# Patient Record
Sex: Female | Born: 1990
Health system: Southern US, Community
[De-identification: ages and names within clinical notes are randomized; demographics above are authoritative.]

## PROBLEM LIST (undated history)

## (undated) ENCOUNTER — Inpatient Hospital Stay (HOSPITAL_COMMUNITY): Payer: Self-pay

## (undated) DIAGNOSIS — Z91410 Personal history of adult physical and sexual abuse: Secondary | ICD-10-CM

## (undated) DIAGNOSIS — Z87820 Personal history of traumatic brain injury: Secondary | ICD-10-CM

## (undated) DIAGNOSIS — T148XXA Other injury of unspecified body region, initial encounter: Secondary | ICD-10-CM

## (undated) DIAGNOSIS — F4310 Post-traumatic stress disorder, unspecified: Secondary | ICD-10-CM

## (undated) DIAGNOSIS — F418 Other specified anxiety disorders: Secondary | ICD-10-CM

## (undated) HISTORY — PX: THERAPEUTIC ABORTION: SHX798

## (undated) HISTORY — PX: HERNIA REPAIR: SHX51

---

## 2018-10-02 ENCOUNTER — Emergency Department (HOSPITAL_COMMUNITY)
Admission: EM | Admit: 2018-10-02 | Discharge: 2018-10-02 | Disposition: A | Discharge status: Home / Self Care | Payer: Medicaid Other | Attending: Emergency Medicine | Admitting: Emergency Medicine

## 2018-10-02 ENCOUNTER — Other Ambulatory Visit: Payer: Self-pay

## 2018-10-02 ENCOUNTER — Encounter (HOSPITAL_COMMUNITY): Payer: Self-pay | Admitting: Emergency Medicine

## 2018-10-02 DIAGNOSIS — R112 Nausea with vomiting, unspecified: Secondary | ICD-10-CM

## 2018-10-02 DIAGNOSIS — K920 Hematemesis: Secondary | ICD-10-CM

## 2018-10-02 DIAGNOSIS — O210 Mild hyperemesis gravidarum: Secondary | ICD-10-CM | POA: Diagnosis not present

## 2018-10-02 DIAGNOSIS — Z3A01 Less than 8 weeks gestation of pregnancy: Secondary | ICD-10-CM | POA: Diagnosis not present

## 2018-10-02 LAB — CBC
HCT: 36.7 % (ref 36.0–46.0)
Hemoglobin: 13.1 g/dL (ref 12.0–15.0)
MCH: 32 pg (ref 26.0–34.0)
MCHC: 35.7 g/dL (ref 30.0–36.0)
MCV: 89.7 fL (ref 80.0–100.0)
Platelets: 207 10*3/uL (ref 150–400)
RBC: 4.09 MIL/uL (ref 3.87–5.11)
RDW: 11.9 % (ref 11.5–15.5)
WBC: 10.5 10*3/uL (ref 4.0–10.5)
nRBC: 0 % (ref 0.0–0.2)

## 2018-10-02 LAB — BASIC METABOLIC PANEL
Anion gap: 7 (ref 5–15)
BUN: 10 mg/dL (ref 6–20)
CO2: 23 mmol/L (ref 22–32)
Calcium: 8.9 mg/dL (ref 8.9–10.3)
Chloride: 104 mmol/L (ref 98–111)
Creatinine, Ser: 0.56 mg/dL (ref 0.44–1.00)
GFR calc Af Amer: >60 mL/min (ref >60)
GFR calc non Af Amer: >60 mL/min (ref >60)
Glucose, Bld: 86 mg/dL (ref 70–99)
POTASSIUM: 3.6 mmol/L (ref 3.5–5.1)
Sodium: 134 mmol/L — ABNORMAL LOW (ref 135–145)

## 2018-10-02 LAB — URINALYSIS, ROUTINE W REFLEX MICROSCOPIC
Bilirubin Urine: NEGATIVE
GLUCOSE, UA: NEGATIVE mg/dL
Hgb urine dipstick: NEGATIVE
Ketones, ur: NEGATIVE mg/dL
Leukocytes, UA: NEGATIVE
Nitrite: NEGATIVE
Protein, ur: NEGATIVE mg/dL
Specific Gravity, Urine: 1.02 (ref 1.005–1.030)
pH: 7 (ref 5.0–8.0)

## 2018-10-02 LAB — PREGNANCY, URINE: Preg Test, Ur: POSITIVE — AB

## 2018-10-02 NOTE — ED Triage Notes (Signed)
[redacted] week pregnant with first baby, New to the area and needs an OB doctor. This morning abdominal pain, cramping and  Vomiting. Denies vaginal bleeding.

## 2018-10-02 NOTE — Discharge Instructions (Signed)
Diclegis is made up of 2 medicines - Doxylamine and Pyridoxine (Vitamin B6)  You may take the following medications, doxylamine 25 mg and pyridoxine (Vit B6) 10mg  every 6 hours as needed for nausea and vomiting  Clear liquids for the next 24 hours, call the OB/GYN listed above or the OB/GYN of your choice for follow-up within the next week.  Seek medical exam for severe or worsening vomiting, increased bleeding with vomiting, blood in the stools or increasing chest pain immediately.

## 2018-10-02 NOTE — ED Provider Notes (Signed)
[facility] EMERGENCY DEPARTMENT Provider Note   CSN: [id number] Arrival date & time: 10/02/18  1258     History   Chief Complaint No chief complaint on file.   HPI Mohammed Kindleshley Tackman is a 27 y.o. female.  HPI  27 y/o female - presents with nausea and vomiting - started a month ago when she found out that she was pregnant - she went to the ER and had her pregnancy confirmed told that she was [redacted] weeks along and b/c of cramping which she has had daily for the last month - had an US showing a gestational sac -no other findings - told she had early pregnancy - has had a couple of weeks of n/v and then the last week did very well - she has now moved here from [place] - today she awoke feeling great - ate 2 eggs and an apple and then promptely vomited and vomited very hard - dry heaving and causing petechiae around her eyes and on her face - she now feels back to her baseline without pain or nausea and has no cramps - she denies any other symptoms including fevers, chills, headache, sore throat, visual changes, neck pain, back pain, chest pain, abdominal pain, shortness of breath, cough, dysuria, diarrhea, rectal bleeding, swelling, rashes, numbness or weakness.    She is G1P0 at about 6 weeks by patients history.  She has not been taking anything for nausea at home this month.  She has no other PMHand is taking a PNV but no other meds -   History reviewed. No pertinent past medical history.  There are no active problems to display for this patient.    The histories are not reviewed yet. Please review them in the "History" navigator section and refresh this SmartLink.   OB History    Gravida  1   Para      Term      Preterm      AB      Living        SAB      TAB      Ectopic      Multiple      Live Births               Home Medications    Prior to Admission medications   Not on File    Family History No family history on file.  Social History Social History    Tobacco Use  . Smoking status: Never Smoker  . Smokeless tobacco: Never Used  Substance Use Topics  . Alcohol use: Not Currently  . Drug use: Not Currently     Allergies   Red dye   Review of Systems Review of Systems  All other systems reviewed and are negative.    Physical Exam Updated Vital Signs BP 107/63 (BP Location: Right Arm)   Pulse 82   Temp 98.1 F (36.7 C) (Oral)   Resp 12   Ht 1.575 m (5\' 2" )   Wt 62.6 kg   LMP 08/08/2018   SpO2 99%   BMI 25.24 kg/m   Physical Exam  Constitutional: She appears well-developed and well-nourished. No distress.  HENT:  Head: Normocephalic and atraumatic.  Mouth/Throat: Oropharynx is clear and moist. No oropharyngeal exudate.  Eyes: Pupils are equal, round, and reactive to light. Conjunctivae and EOM are normal. Right eye exhibits no discharge. Left eye exhibits no discharge. No scleral icterus.  Neck: Normal range of motion. Neck supple. No JVD  present. No thyromegaly present.  Cardiovascular: Normal rate, regular rhythm, normal heart sounds and intact distal pulses. Exam reveals no gallop and no friction rub.  No murmur heard. Pulmonary/Chest: Effort normal and breath sounds normal. No respiratory distress. She has no wheezes. She has no rales.  Abdominal: Soft. Bowel sounds are normal. She exhibits no distension and no mass. There is no tenderness.  Soft and NT abd.  Musculoskeletal: Normal range of motion. She exhibits no edema or tenderness.  Lymphadenopathy:    She has no cervical adenopathy.  Neurological: She is alert. Coordination normal.  Skin: Skin is warm and dry. No rash noted. No erythema.  Petechiae around the eyes bilaterally, subtle.  No other rashes  Psychiatric: She has a normal mood and affect. Her behavior is normal.  Nursing note and vitals reviewed.    ED Treatments / Results  Labs (all labs ordered are listed, but only abnormal results are displayed) Labs Reviewed  PREGNANCY, URINE -  Abnormal; Notable for the following components:      Result Value   Preg Test, Ur POSITIVE (*)    All other components within normal limits  URINALYSIS, ROUTINE W REFLEX MICROSCOPIC - Abnormal; Notable for the following components:   APPearance CLOUDY (*)    All other components within normal limits  BASIC METABOLIC PANEL - Abnormal; Notable for the following components:   Sodium 134 (*)    All other components within normal limits  CBC    EKG None  Radiology No results found.  Procedures Procedures (including critical care time)  Medications Ordered in ED Medications - No data to display   Initial Impression / Assessment and Plan / ED Course  I have reviewed the triage vital signs and the nursing notes.  Pertinent labs & imaging results that were available during my care of the patient were reviewed by me and considered in my medical decision making (see chart for details).  Clinical Course as of Oct 02 1636  Mon Oct 02, 2018  1528 My bedside US shows that there is an IUP - small - visualized fetus.  No extrauterine fluid appreciated.   [BM]  1632 Metabolic panel and CBC are unremarkable with no leukocytosis renal dysfunction or electrolyte abnormalities, the patient is stable for discharge, medications recommended to do home diclegis   [BM]    Clinical Course User Index [BM] Eber Hong, MD   The pt is well appaering, check BMP to r/o hypokalemia - check UA - clean without infeciton - bedside US to eval for IUP progress and refer to OBGYN, will give some nausea meds for home.  Final Clinical Impressions(s) / ED Diagnoses   Final diagnoses:  Nausea and vomiting, intractability of vomiting not specified, unspecified vomiting type  Hyperemesis arising during pregnancy  Hematemesis with nausea      Eber Hong, MD 10/02/18 [phone]

## 2018-10-11 DIAGNOSIS — F418 Other specified anxiety disorders: Secondary | ICD-10-CM | POA: Insufficient documentation

## 2018-10-18 DIAGNOSIS — Z283 Underimmunization status: Secondary | ICD-10-CM | POA: Insufficient documentation

## 2018-10-18 DIAGNOSIS — O09899 Supervision of other high risk pregnancies, unspecified trimester: Secondary | ICD-10-CM | POA: Insufficient documentation

## 2018-11-01 NOTE — L&D Delivery Note (Addendum)
  Patient: Casey West MRN: [id number]  GBS status: Positive, IAP given: PCN   Patient is a 28 y.o. now G5P1 s/p NSVD at [redacted]w[redacted]d, who was admitted for IOL for severe Pre-E. AROM 11h 55m prior to delivery with moderate meconium stained fluid.   Delivery Note At 2:47 PM a viable and healthy female was delivered via Vaginal, Spontaneous (Presentation: Vertex ).  APGAR: 8, 9; weight pending .   Placenta status: spontaneous and intact.  Cord: 3 vessel with the following complications: short cord.     Head delivered LOA. No nuchal cord present. Shoulder and body delivered in usual fashion. Infant with spontaneous cry, placed on mother's abdomen, dried and bulb suctioned. Short cord noted. Cord clamped x 2 after 1-minute delay, and cut by family member. Cord blood drawn. Placenta delivered spontaneously with gentle cord traction. Fundus firm with massage and Pitocin. Perineum inspected and found to have first degree laceration which was hemostatic with good tissue approximation. Elected not to repair due to extremely edematous vaginal walls and labia.   Anesthesia: Epidural Episiotomy: None Lacerations: 1st degree Suture Repair: No suture placed. Laceration was hemostatic and anatomy is edematous Est. Blood Loss (mL): 9  Mom to postpartum.  Will need Mg++ for 24 hours postpartum. Baby to Couplet care / Skin to Skin.  Gerlene Fee, D.O. Family Medicine Resident PGY-1 05/02/2019, 3:06 PM   OB FELLOW DELIVERY ATTESTATION  I was gloved and present for the delivery in its entirety, and I agree with the above resident's note.    Phill Myron, D.O. OB Fellow  05/02/2019, 5:49 PM

## 2019-01-19 ENCOUNTER — Encounter (INDEPENDENT_AMBULATORY_CARE_PROVIDER_SITE_OTHER): Payer: Self-pay | Admitting: Nurse Practitioner

## 2019-01-25 ENCOUNTER — Encounter (INDEPENDENT_AMBULATORY_CARE_PROVIDER_SITE_OTHER): Payer: Self-pay | Admitting: Nurse Practitioner

## 2019-03-08 DIAGNOSIS — Z3493 Encounter for supervision of normal pregnancy, unspecified, third trimester: Secondary | ICD-10-CM | POA: Diagnosis not present

## 2019-03-29 DIAGNOSIS — O9989 Other specified diseases and conditions complicating pregnancy, childbirth and the puerperium: Secondary | ICD-10-CM | POA: Diagnosis not present

## 2019-03-29 DIAGNOSIS — Z3A34 34 weeks gestation of pregnancy: Secondary | ICD-10-CM | POA: Diagnosis not present

## 2019-03-29 DIAGNOSIS — Z3689 Encounter for other specified antenatal screening: Secondary | ICD-10-CM | POA: Diagnosis not present

## 2019-03-29 DIAGNOSIS — Z283 Underimmunization status: Secondary | ICD-10-CM | POA: Diagnosis not present

## 2019-04-04 DIAGNOSIS — F4310 Post-traumatic stress disorder, unspecified: Secondary | ICD-10-CM | POA: Diagnosis not present

## 2019-04-12 DIAGNOSIS — Z3689 Encounter for other specified antenatal screening: Secondary | ICD-10-CM | POA: Diagnosis not present

## 2019-04-24 DIAGNOSIS — F4310 Post-traumatic stress disorder, unspecified: Secondary | ICD-10-CM | POA: Diagnosis not present

## 2019-04-26 DIAGNOSIS — Z3493 Encounter for supervision of normal pregnancy, unspecified, third trimester: Secondary | ICD-10-CM | POA: Diagnosis not present

## 2019-05-01 ENCOUNTER — Encounter (HOSPITAL_COMMUNITY): Payer: Self-pay | Admitting: Obstetrics & Gynecology

## 2019-05-01 ENCOUNTER — Other Ambulatory Visit: Payer: Self-pay

## 2019-05-01 ENCOUNTER — Encounter: Payer: Self-pay | Admitting: Obstetrics & Gynecology

## 2019-05-01 ENCOUNTER — Inpatient Hospital Stay (HOSPITAL_COMMUNITY)
Admission: EM | Admit: 2019-05-01 | Discharge: 2019-05-04 | DRG: 807 | Disposition: A | Discharge status: Home / Self Care | Payer: BC Managed Care – PPO | Attending: Family Medicine | Admitting: Family Medicine

## 2019-05-01 ENCOUNTER — Other Ambulatory Visit: Payer: Self-pay | Admitting: Obstetrics & Gynecology

## 2019-05-01 DIAGNOSIS — O99824 Streptococcus B carrier state complicating childbirth: Secondary | ICD-10-CM | POA: Diagnosis not present

## 2019-05-01 DIAGNOSIS — Z3A38 38 weeks gestation of pregnancy: Secondary | ICD-10-CM | POA: Diagnosis not present

## 2019-05-01 DIAGNOSIS — Z1159 Encounter for screening for other viral diseases: Secondary | ICD-10-CM

## 2019-05-01 DIAGNOSIS — F4310 Post-traumatic stress disorder, unspecified: Secondary | ICD-10-CM | POA: Diagnosis present

## 2019-05-01 DIAGNOSIS — O1414 Severe pre-eclampsia complicating childbirth: Principal | ICD-10-CM | POA: Diagnosis present

## 2019-05-01 DIAGNOSIS — O09899 Supervision of other high risk pregnancies, unspecified trimester: Secondary | ICD-10-CM

## 2019-05-01 DIAGNOSIS — Z91410 Personal history of adult physical and sexual abuse: Secondary | ICD-10-CM

## 2019-05-01 DIAGNOSIS — O99344 Other mental disorders complicating childbirth: Secondary | ICD-10-CM | POA: Diagnosis not present

## 2019-05-01 DIAGNOSIS — Z283 Underimmunization status: Secondary | ICD-10-CM

## 2019-05-01 DIAGNOSIS — F418 Other specified anxiety disorders: Secondary | ICD-10-CM | POA: Diagnosis not present

## 2019-05-01 DIAGNOSIS — O99820 Streptococcus B carrier state complicating pregnancy: Secondary | ICD-10-CM

## 2019-05-01 DIAGNOSIS — Z2839 Other underimmunization status: Secondary | ICD-10-CM

## 2019-05-01 DIAGNOSIS — O1413 Severe pre-eclampsia, third trimester: Secondary | ICD-10-CM

## 2019-05-01 DIAGNOSIS — Z3A39 39 weeks gestation of pregnancy: Secondary | ICD-10-CM | POA: Diagnosis not present

## 2019-05-01 HISTORY — DX: Personal history of traumatic brain injury: Z87.820

## 2019-05-01 HISTORY — DX: Other injury of unspecified body region, initial encounter: T14.8XXA

## 2019-05-01 HISTORY — DX: Personal history of adult physical and sexual abuse: Z91.410

## 2019-05-01 HISTORY — DX: Post-traumatic stress disorder, unspecified: F43.10

## 2019-05-01 HISTORY — DX: Other specified anxiety disorders: F41.8

## 2019-05-01 LAB — COMPREHENSIVE METABOLIC PANEL
ALT: 32 U/L (ref 0–44)
ALT: 37 U/L (ref 0–44)
AST: 36 U/L (ref 15–41)
AST: 43 U/L — ABNORMAL HIGH (ref 15–41)
Albumin: 3.1 g/dL — ABNORMAL LOW (ref 3.5–5.0)
Albumin: 3.1 g/dL — ABNORMAL LOW (ref 3.5–5.0)
Alkaline Phosphatase: 136 U/L — ABNORMAL HIGH (ref 38–126)
Alkaline Phosphatase: 147 U/L — ABNORMAL HIGH (ref 38–126)
Anion gap: 9 (ref 5–15)
Anion gap: 10 (ref 5–15)
BUN: 9 mg/dL (ref 6–20)
BUN: 9 mg/dL (ref 6–20)
CO2: 19 mmol/L — ABNORMAL LOW (ref 22–32)
CO2: 22 mmol/L (ref 22–32)
Calcium: 9.6 mg/dL (ref 8.9–10.3)
Calcium: 8.8 mg/dL — ABNORMAL LOW (ref 8.9–10.3)
Chloride: 106 mmol/L (ref 98–111)
Chloride: 104 mmol/L (ref 98–111)
Creatinine, Ser: 0.68 mg/dL (ref 0.44–1.00)
Creatinine, Ser: 0.76 mg/dL (ref 0.44–1.00)
GFR calc Af Amer: >60 mL/min (ref >60)
GFR calc Af Amer: >60 mL/min (ref >60)
GFR calc non Af Amer: >60 mL/min (ref >60)
GFR calc non Af Amer: >60 mL/min (ref >60)
Glucose, Bld: 101 mg/dL — ABNORMAL HIGH (ref 70–99)
Glucose, Bld: 101 mg/dL — ABNORMAL HIGH (ref 70–99)
Potassium: 4.4 mmol/L (ref 3.5–5.1)
Potassium: 5 mmol/L (ref 3.5–5.1)
Sodium: 134 mmol/L — ABNORMAL LOW (ref 135–145)
Sodium: 136 mmol/L (ref 135–145)
Total Bilirubin: 0.8 mg/dL (ref 0.3–1.2)
Total Bilirubin: 0.5 mg/dL (ref 0.3–1.2)
Total Protein: 6.3 g/dL — ABNORMAL LOW (ref 6.5–8.1)
Total Protein: 6.1 g/dL — ABNORMAL LOW (ref 6.5–8.1)

## 2019-05-01 LAB — RAPID URINE DRUG SCREEN, HOSP PERFORMED
Amphetamines: NOT DETECTED
Barbiturates: NOT DETECTED
Benzodiazepines: NOT DETECTED
Cocaine: NOT DETECTED
Opiates: NOT DETECTED
Tetrahydrocannabinol: NOT DETECTED

## 2019-05-01 LAB — SARS CORONAVIRUS 2 BY RT PCR (HOSPITAL ORDER, PERFORMED IN ~~LOC~~ HOSPITAL LAB): SARS Coronavirus 2: NEGATIVE

## 2019-05-01 LAB — ABO/RH: ABO/RH(D): A POS

## 2019-05-01 LAB — CBC
HCT: 39 % (ref 36.0–46.0)
HCT: 39.3 % (ref 36.0–46.0)
Hemoglobin: 13.9 g/dL (ref 12.0–15.0)
Hemoglobin: 14.1 g/dL (ref 12.0–15.0)
MCH: 32.3 pg (ref 26.0–34.0)
MCH: 32.6 pg (ref 26.0–34.0)
MCHC: 35.6 g/dL (ref 30.0–36.0)
MCHC: 35.9 g/dL (ref 30.0–36.0)
MCV: 90.5 fL (ref 80.0–100.0)
MCV: 90.8 fL (ref 80.0–100.0)
Platelets: 176 10*3/uL (ref 150–400)
Platelets: 177 10*3/uL (ref 150–400)
RBC: 4.31 MIL/uL (ref 3.87–5.11)
RBC: 4.33 MIL/uL (ref 3.87–5.11)
RDW: 12.3 % (ref 11.5–15.5)
RDW: 12 % (ref 11.5–15.5)
WBC: 14.5 10*3/uL — ABNORMAL HIGH (ref 4.0–10.5)
WBC: 15.2 10*3/uL — ABNORMAL HIGH (ref 4.0–10.5)
nRBC: 0 % (ref 0.0–0.2)
nRBC: 0 % (ref 0.0–0.2)

## 2019-05-01 LAB — PROTEIN / CREATININE RATIO, URINE
Creatinine, Urine: 50.31 mg/dL
Protein Creatinine Ratio: 0.5 mg/mg{Cre} — ABNORMAL HIGH (ref 0.00–0.15)
Total Protein, Urine: 25 mg/dL

## 2019-05-01 LAB — RPR: RPR Ser Ql: NONREACTIVE

## 2019-05-01 LAB — MAGNESIUM: Magnesium: 5 mg/dL — ABNORMAL HIGH (ref 1.7–2.4)

## 2019-05-01 MED ORDER — PHENYLEPHRINE 40 MCG/ML (10ML) SYRINGE FOR IV PUSH (FOR BLOOD PRESSURE SUPPORT)
80.0000 ug | PREFILLED_SYRINGE | INTRAVENOUS | Status: DC | PRN
  Filled 2019-05-01 (×2): qty 10

## 2019-05-01 MED ORDER — OXYTOCIN 40 UNITS IN NORMAL SALINE INFUSION - SIMPLE MED
1.0000 m[IU]/min | INTRAVENOUS | Status: DC
  Administered 2019-05-01: 2 m[IU]/min via INTRAVENOUS
  Filled 2019-05-01: qty 1000

## 2019-05-01 MED ORDER — LABETALOL HCL 5 MG/ML IV SOLN: 20.0000 mg | INTRAVENOUS | Status: DC | PRN

## 2019-05-01 MED ORDER — OXYCODONE-ACETAMINOPHEN 5-325 MG PO TABS: 2.0000 | ORAL_TABLET | ORAL | Status: DC | PRN

## 2019-05-01 MED ORDER — HYDRALAZINE HCL 20 MG/ML IJ SOLN: 10.0000 mg | INTRAMUSCULAR | Status: DC | PRN

## 2019-05-01 MED ORDER — ONDANSETRON HCL 4 MG/2ML IJ SOLN
4.0000 mg | Freq: Four times a day (QID) | INTRAMUSCULAR | Status: DC | PRN
  Administered 2019-05-01 – 2019-05-02 (×2): 4 mg via INTRAVENOUS
  Filled 2019-05-01 (×2): qty 2

## 2019-05-01 MED ORDER — PHENYLEPHRINE 40 MCG/ML (10ML) SYRINGE FOR IV PUSH (FOR BLOOD PRESSURE SUPPORT)
80.0000 ug | PREFILLED_SYRINGE | INTRAVENOUS | Status: DC | PRN
  Administered 2019-05-02: 03:00:00 80 ug via INTRAVENOUS
  Filled 2019-05-01: qty 10

## 2019-05-01 MED ORDER — SODIUM CHLORIDE 0.9 % IV SOLN
5.0000 10*6.[IU] | Freq: Once | INTRAVENOUS | Status: AC
  Administered 2019-05-01: 5 10*6.[IU] via INTRAVENOUS
  Filled 2019-05-01: qty 5

## 2019-05-01 MED ORDER — ACETAMINOPHEN 325 MG PO TABS
650.0000 mg | ORAL_TABLET | ORAL | Status: DC | PRN
  Administered 2019-05-01 (×2): 650 mg via ORAL
  Filled 2019-05-01 (×2): qty 2

## 2019-05-01 MED ORDER — MAGNESIUM SULFATE BOLUS VIA INFUSION
6.0000 g | Freq: Once | INTRAVENOUS | Status: AC
  Administered 2019-05-01: 07:00:00 6 g via INTRAVENOUS
  Filled 2019-05-01: qty 500

## 2019-05-01 MED ORDER — LACTATED RINGERS IV SOLN
500.0000 mL | INTRAVENOUS | Status: DC | PRN
  Administered 2019-05-02: 03:00:00 500 mL via INTRAVENOUS

## 2019-05-01 MED ORDER — FLEET ENEMA 7-19 GM/118ML RE ENEM: 1.0000 | ENEMA | Freq: Every day | RECTAL | Status: DC | PRN

## 2019-05-01 MED ORDER — SOD CITRATE-CITRIC ACID 500-334 MG/5ML PO SOLN: 30.0000 mL | ORAL | Status: DC | PRN

## 2019-05-01 MED ORDER — LABETALOL HCL 5 MG/ML IV SOLN: 40.0000 mg | INTRAVENOUS | Status: DC | PRN

## 2019-05-01 MED ORDER — MAGNESIUM SULFATE 40 G IN LACTATED RINGERS - SIMPLE
2.0000 g/h | INTRAVENOUS | Status: DC
  Administered 2019-05-01 – 2019-05-02 (×2): 2 g/h via INTRAVENOUS
  Filled 2019-05-01 (×2): qty 500

## 2019-05-01 MED ORDER — MISOPROSTOL 50MCG HALF TABLET
ORAL_TABLET | ORAL | Status: AC
  Administered 2019-05-01: 50 ug via ORAL
  Filled 2019-05-01: qty 1

## 2019-05-01 MED ORDER — LACTATED RINGERS IV SOLN: 500.0000 mL | Freq: Once | INTRAVENOUS | Status: DC

## 2019-05-01 MED ORDER — LACTATED RINGERS IV SOLN
INTRAVENOUS | Status: DC
  Administered 2019-05-01: 75 mL/h via INTRAVENOUS
  Administered 2019-05-02: 04:00:00 via INTRAVENOUS

## 2019-05-01 MED ORDER — OXYTOCIN BOLUS FROM INFUSION
500.0000 mL | Freq: Once | INTRAVENOUS | Status: AC
  Administered 2019-05-02: 500 mL via INTRAVENOUS

## 2019-05-01 MED ORDER — MISOPROSTOL 25 MCG QUARTER TABLET: 25.0000 ug | ORAL_TABLET | ORAL | Status: DC | PRN

## 2019-05-01 MED ORDER — HYDRALAZINE HCL 20 MG/ML IJ SOLN: 5.0000 mg | INTRAMUSCULAR | Status: DC | PRN

## 2019-05-01 MED ORDER — LIDOCAINE HCL (PF) 1 % IJ SOLN: 30.0000 mL | INTRAMUSCULAR | Status: DC | PRN

## 2019-05-01 MED ORDER — EPHEDRINE 5 MG/ML INJ
10.0000 mg | INTRAVENOUS | Status: DC | PRN
  Filled 2019-05-01: qty 2

## 2019-05-01 MED ORDER — FENTANYL CITRATE (PF) 100 MCG/2ML IJ SOLN
50.0000 ug | INTRAMUSCULAR | Status: DC | PRN
  Administered 2019-05-01: 100 ug via INTRAVENOUS
  Filled 2019-05-01: qty 2

## 2019-05-01 MED ORDER — FENTANYL-BUPIVACAINE-NACL 0.5-0.125-0.9 MG/250ML-% EP SOLN
12.0000 mL/h | EPIDURAL | Status: DC | PRN
  Filled 2019-05-01: qty 250

## 2019-05-01 MED ORDER — OXYTOCIN 40 UNITS IN NORMAL SALINE INFUSION - SIMPLE MED: 2.5000 [IU]/h | INTRAVENOUS | Status: DC

## 2019-05-01 MED ORDER — MISOPROSTOL 50MCG HALF TABLET
50.0000 ug | ORAL_TABLET | ORAL | Status: DC | PRN
  Administered 2019-05-01 (×2): 50 ug via ORAL
  Filled 2019-05-01: qty 1

## 2019-05-01 MED ORDER — DIPHENHYDRAMINE HCL 50 MG/ML IJ SOLN: 12.5000 mg | INTRAMUSCULAR | Status: DC | PRN

## 2019-05-01 MED ORDER — TERBUTALINE SULFATE 1 MG/ML IJ SOLN: 0.2500 mg | Freq: Once | INTRAMUSCULAR | Status: DC | PRN

## 2019-05-01 MED ORDER — OXYCODONE-ACETAMINOPHEN 5-325 MG PO TABS: 1.0000 | ORAL_TABLET | ORAL | Status: DC | PRN

## 2019-05-01 MED ORDER — OXYTOCIN 40 UNITS IN NORMAL SALINE INFUSION - SIMPLE MED: 1.0000 m[IU]/min | INTRAVENOUS | Status: DC

## 2019-05-01 MED ORDER — PENICILLIN G 3 MILLION UNITS IVPB - SIMPLE MED
3.0000 10*6.[IU] | INTRAVENOUS | Status: DC
  Administered 2019-05-01 – 2019-05-02 (×7): 3 10*6.[IU] via INTRAVENOUS
  Filled 2019-05-01 (×10): qty 100

## 2019-05-01 NOTE — Progress Notes (Signed)
OB/GYN Faculty Practice: Labor Progress Note  Subjective:  Standing up due to soreness from sitting down. Feeling nervous.   Objective:  BP (!) 148/92   Pulse 88   Temp 97.8 F (36.6 C) (Oral)   Resp 18   Ht 5\' 2"  (1.575 m)   Wt 79.4 kg   LMP 08/12/2018   SpO2 99%   BMI 32.01 kg/m  Gen: Standing up next to bedside. NAD.  Extremities: No signs of DVT.  No BLEE.  CE: Dilation: 2 Effacement (%): 70 Station: -2 Presentation: Vertex Exam by:: dr Nehemiah Settle Contractions: regular q1-67minutes FH: BL 120, mod var, + a, -d.   Assessment and Plan:  Casey West is a 28 y.o. F6E3329 at [redacted]w[redacted]d - IOL PEC  Labor: s/p cytotec x 1 @ 5188. Expectant management. Recheck in four hours.  . Pain control: IV meds . Anticipated MOD: NSVD . PPH Risk: low   Hypertension: BP: (126-154)/(92-103) 148/92 (06/30 1001). Reported an episode of vision blurriness that self resolved. Has not required any PRN antihypertensives  Magnesium 6g blous, and 2g/hour  Monitor Bps & for symptoms.   hydral to labetolol protocol PRN  Fetal Wellbeing: Cat I tracing . GBS POS > PCN . Continuous fetal monitoring  Zettie Cooley, M.D.  Family Medicine  PGY-1 05/01/2019 10:17 AM
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
## 2019-05-01 NOTE — Progress Notes (Signed)
OB/GYN Faculty Practice: Labor Progress Note  Subjective:  Patient's contractions getting stronger  Objective:  BP (!) 136/92   Pulse 83   Temp 98.3 F (36.8 C) (Oral)   Resp 18   Ht 5\' 2"  (1.575 m)   Wt 79.4 kg   LMP 08/12/2018   SpO2 96%   BMI 32.01 kg/m  Gen: Sitting on inflatable seat, breathing through contractions . NAD.  Extremities: No signs of DVT.   CE: Dilation: 5 Effacement (%): Thick Station: -3 Presentation: Vertex Exam by:: Dr Maudie Mercury Contractions: w2-4 minutes FH: BL 130, mod var, + a, -d.   Assessment and Plan:  Clay Menser is a 28 y.o. G5P0040 at [redacted]w[redacted]d - IOL PEC   Labor:s/p cytotec x 2 & f/b. Cervix is 5 cm, thick and ballotable.    Start pitocin 2x2   Pain control:IV meds PRN.   Anticipated SHF:WYOV  PPH Risk:low   Hypertension: Bps have remained stable and under 140SBP/90DBP.  No other symptoms at this time.   S/p Magnesium bolus  Monitor Bps &for symptoms.   hydral to labetolol protocol PRN  Fetal Wellbeing: CatItracing  GBSPOS > PCN  Continuous fetal monitoring  Zettie Cooley, M.D.  Family Medicine  PGY-1 05/01/2019 5:14 PM
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
## 2019-05-01 NOTE — Progress Notes (Signed)
OB/GYN Faculty Practice: Labor Progress Note  Subjective:  Up and walking around. Doing well, no significant contraction pain.   Objective:  BP (!) 138/92   Pulse 86   Temp 98.3 F (36.8 C) (Oral)   Resp 18   Ht 5\' 2"  (1.575 m)   Wt 79.4 kg   LMP 08/12/2018   SpO2 96%   BMI 32.01 kg/m  Gen: Coming out of bathroom, standing up. NAD.  Extremities: No signs of DVT.   CE: Dilation: 2 Effacement (%): 70 Station: -2 Presentation: Vertex Exam by:: Sharyn Lull, RN  Contractions: q2-4 minutes FH: BL 140, mod var, + a, -d.   Assessment and Plan:  Casey West is a 28 y.o. N2D7824 at [redacted]w[redacted]d - IOL for PEC   Labor: s/p cytotec x 2 @ 1300. Placed FB. Cervix is 2cm and thick. Baby's head is ballotable.   Pain control: IV meds PRN.   Anticipated MOD: NSVD  PPH Risk: low   Hypertension: Bps have remained stable and under 140SBP/90DBP.  No other symptoms at this time.   S/p Magnesium 6g blous, and 2g/hour  Monitor Bps & for symptoms.   hydral to labetolol protocol PRN  Fetal Wellbeing: Cat I tracing  GBS POS > PCN  Continuous fetal monitoring  Zettie Cooley, M.D.  Family Medicine  PGY-1 05/01/2019 2:00 PM

## 2019-05-01 NOTE — MAU Note (Signed)
Sharp right upper abdominal pain that started at 0200 and has been getting worse.  Not feeling any CTX.  No LOF.  Some brown discharge when she wipes.  + FM.

## 2019-05-01 NOTE — Progress Notes (Signed)
OB/GYN Faculty Practice: Labor Progress Note  Subjective: Into room to introduce self to patient but she is sleeping.   Objective: BP 122/80   Pulse 72   Temp 98.1 F (36.7 C) (Oral)   Resp 18   Ht 5\' 2"  (1.575 m)   Wt 79.4 kg   LMP 08/12/2018   SpO2 96%   BMI 32.01 kg/m  Gen: Sleeping. Dilation: 5.5 Effacement (%): 80, 70 Station: -3 Presentation: Vertex Exam by:: Joesph July, RN  Assessment and Plan: 28 y.o. [id number] [redacted]w[redacted]d here for IOL for severe preeclampsia.   Labor: Induction started this morning with cytotec and FB, FB now out and recently started on pitocin. Continuing to titrate per protocol. Plan to reassess at next check and consider early AROM if able.  -- pain control: open to options -- PPH Risk: medium (Mg++, likely prolonged induction)  Severe Preeclampsia (RUQ Pain): Labs wnl - AST 43, ALT 37, Cr 0.77. Platelets 177. UPC 0.50. Recent Mg++ level 5.0. BP has been normal to moderate range. Adequate UOP, no signs of Mg++ toxicity. Has not required any IV prn anti-hypertensives.  -- continue Mg++  -- continue to monitor closely -- repeat [place] labs in am  Fetal Well-Being: Cephalic by sutures on recent check.  -- Category I - continuous fetal monitoring  -- GBS positive (PCN)  Laurel S. Juleen China, DO OB/GYN Fellow, Faculty Practice  10:18 PM

## 2019-05-02 ENCOUNTER — Encounter (HOSPITAL_COMMUNITY): Payer: Self-pay | Admitting: *Deleted

## 2019-05-02 ENCOUNTER — Inpatient Hospital Stay (HOSPITAL_COMMUNITY): Payer: BC Managed Care – PPO | Admitting: Anesthesiology

## 2019-05-02 DIAGNOSIS — O99824 Streptococcus B carrier state complicating childbirth: Secondary | ICD-10-CM

## 2019-05-02 DIAGNOSIS — O1490 Unspecified pre-eclampsia, unspecified trimester: Secondary | ICD-10-CM

## 2019-05-02 LAB — CBC
HCT: 37.4 % (ref 36.0–46.0)
HCT: 39.3 % (ref 36.0–46.0)
Hemoglobin: 13.4 g/dL (ref 12.0–15.0)
Hemoglobin: 14.4 g/dL (ref 12.0–15.0)
MCH: 33 pg (ref 26.0–34.0)
MCH: 33.3 pg (ref 26.0–34.0)
MCHC: 35.8 g/dL (ref 30.0–36.0)
MCHC: 36.6 g/dL — ABNORMAL HIGH (ref 30.0–36.0)
MCV: 89.9 fL (ref 80.0–100.0)
MCV: 93 fL (ref 80.0–100.0)
Platelets: 138 10*3/uL — ABNORMAL LOW (ref 150–400)
Platelets: 167 10*3/uL (ref 150–400)
RBC: 4.02 MIL/uL (ref 3.87–5.11)
RBC: 4.37 MIL/uL (ref 3.87–5.11)
RDW: 12.2 % (ref 11.5–15.5)
RDW: 12.5 % (ref 11.5–15.5)
WBC: 16.6 10*3/uL — ABNORMAL HIGH (ref 4.0–10.5)
WBC: 21.7 10*3/uL — ABNORMAL HIGH (ref 4.0–10.5)
nRBC: 0 % (ref 0.0–0.2)
nRBC: 0 % (ref 0.0–0.2)

## 2019-05-02 LAB — TYPE AND SCREEN
ABO/RH(D): A POS
Antibody Screen: NEGATIVE

## 2019-05-02 MED ORDER — COCONUT OIL OIL: 1.0000 "application " | TOPICAL_OIL | Status: DC | PRN

## 2019-05-02 MED ORDER — FENTANYL CITRATE (PF) 100 MCG/2ML IJ SOLN: 50.0000 ug | Freq: Once | INTRAMUSCULAR | Status: DC

## 2019-05-02 MED ORDER — WITCH HAZEL-GLYCERIN EX PADS: 1.0000 "application " | MEDICATED_PAD | CUTANEOUS | Status: DC | PRN

## 2019-05-02 MED ORDER — PRENATAL MULTIVITAMIN CH: 1.0000 | ORAL_TABLET | Freq: Every day | ORAL | Status: DC

## 2019-05-02 MED ORDER — MAGNESIUM SULFATE 40 G IN LACTATED RINGERS - SIMPLE
2.0000 g/h | INTRAVENOUS | Status: DC
  Administered 2019-05-02: 2 g/h via INTRAVENOUS
  Filled 2019-05-02: qty 500

## 2019-05-02 MED ORDER — LACTATED RINGERS IV SOLN: 500.0000 mL | Freq: Once | INTRAVENOUS | Status: DC

## 2019-05-02 MED ORDER — LIDOCAINE HCL (PF) 1 % IJ SOLN
INTRAMUSCULAR | Status: DC | PRN
  Administered 2019-05-02 (×2): 4 mL via EPIDURAL

## 2019-05-02 MED ORDER — COMPLETENATE 29-1 MG PO CHEW
1.0000 | CHEWABLE_TABLET | Freq: Every day | ORAL | Status: DC
  Administered 2019-05-03: 1 via ORAL
  Filled 2019-05-02: qty 1

## 2019-05-02 MED ORDER — ONDANSETRON HCL 4 MG/2ML IJ SOLN: 4.0000 mg | INTRAMUSCULAR | Status: DC | PRN

## 2019-05-02 MED ORDER — ZOLPIDEM TARTRATE 5 MG PO TABS: 5.0000 mg | ORAL_TABLET | Freq: Every evening | ORAL | Status: DC | PRN

## 2019-05-02 MED ORDER — ACETAMINOPHEN 325 MG PO TABS: 650.0000 mg | ORAL_TABLET | ORAL | Status: DC | PRN

## 2019-05-02 MED ORDER — LACTATED RINGERS AMNIOINFUSION
INTRAVENOUS | Status: DC
  Administered 2019-05-02: 06:00:00 75 mL/h via INTRAUTERINE
  Filled 2019-05-02 (×2): qty 1000

## 2019-05-02 MED ORDER — BENZOCAINE-MENTHOL 20-0.5 % EX AERO
1.0000 "application " | INHALATION_SPRAY | CUTANEOUS | Status: DC | PRN
  Administered 2019-05-03: 1 via TOPICAL
  Filled 2019-05-02: qty 56

## 2019-05-02 MED ORDER — IBUPROFEN 600 MG PO TABS
600.0000 mg | ORAL_TABLET | Freq: Four times a day (QID) | ORAL | Status: DC
  Administered 2019-05-02 – 2019-05-04 (×6): 600 mg via ORAL
  Filled 2019-05-02 (×7): qty 1

## 2019-05-02 MED ORDER — SODIUM CHLORIDE (PF) 0.9 % IJ SOLN
INTRAMUSCULAR | Status: DC | PRN
  Administered 2019-05-02: 12 mL/h via EPIDURAL

## 2019-05-02 MED ORDER — OXYCODONE HCL 5 MG PO TABS: 5.0000 mg | ORAL_TABLET | ORAL | Status: DC | PRN

## 2019-05-02 MED ORDER — TETANUS-DIPHTH-ACELL PERTUSSIS 5-2.5-18.5 LF-MCG/0.5 IM SUSP: 0.5000 mL | Freq: Once | INTRAMUSCULAR | Status: DC

## 2019-05-02 MED ORDER — DIBUCAINE (PERIANAL) 1 % EX OINT: 1.0000 "application " | TOPICAL_OINTMENT | CUTANEOUS | Status: DC | PRN

## 2019-05-02 MED ORDER — SENNOSIDES-DOCUSATE SODIUM 8.6-50 MG PO TABS: 2.0000 | ORAL_TABLET | ORAL | Status: DC

## 2019-05-02 MED ORDER — SIMETHICONE 80 MG PO CHEW: 80.0000 mg | CHEWABLE_TABLET | ORAL | Status: DC | PRN

## 2019-05-02 MED ORDER — MAGNESIUM SULFATE 40 G IN LACTATED RINGERS - SIMPLE: 2.0000 g/h | INTRAVENOUS | Status: DC

## 2019-05-02 MED ORDER — DIPHENHYDRAMINE HCL 25 MG PO CAPS: 25.0000 mg | ORAL_CAPSULE | Freq: Four times a day (QID) | ORAL | Status: DC | PRN

## 2019-05-02 MED ORDER — ONDANSETRON HCL 4 MG PO TABS: 4.0000 mg | ORAL_TABLET | ORAL | Status: DC | PRN

## 2019-05-02 MED ORDER — DIPHENHYDRAMINE HCL 50 MG/ML IJ SOLN
25.0000 mg | Freq: Once | INTRAMUSCULAR | Status: AC
  Administered 2019-05-02: 25 mg via INTRAVENOUS
  Filled 2019-05-02: qty 1

## 2019-05-02 NOTE — Progress Notes (Signed)
OB/GYN Faculty Practice: Labor Progress Note  Subjective: Comfortable with epidural in place. Into room with RN because of recurrent subtle decelerations, not improving with position changes or bolus.  Objective: BP 117/70   Pulse 77   Temp 98.3 F (36.8 C) (Oral)   Resp 18   Ht 5\' 2"  (1.575 m)   Wt 79.4 kg   LMP 08/12/2018   SpO2 95%   BMI 32.01 kg/m  Gen: tearful, uncomfortable appearing during contractions  Dilation: 7 Effacement (%): 70 Station: -2 Presentation: Vertex Exam by:: Dr. Juleen China  Assessment and Plan: 28 y.o. E2C0034 [redacted]w[redacted]d here for IOL for severe preeclampsia.   Labor: BBOW on SVE - initially only felt rim of cervix. Discussed options with patient including AROM and IUPC placement, reviewed risks and benefits. Patient amenable. AROM moderate meconium, cervix actually less effaced and closer to 6-7cm dilated once BBOW no longer distending external os.  -- pain control: epidural - drawing labs now -- PPH Risk: medium (Mg++, likely prolonged induction)  Severe Preeclampsia (RUQ Pain): Moderate range BP, asymptomatic at this time. Labs wnl - AST 43, ALT 37, Cr 0.77. Platelets 177. UPC 0.50. Recent Mg++ level 5.0. BP has been normal to moderate range. Adequate UOP, no signs of Mg++ toxicity. Has not required any IV prn anti-hypertensives.  -- continue Mg++  -- continue to monitor closely -- repeat [place] labs in am  Fetal Well-Being: EFW 6-7lbs. Cephalic by sutures on recent check.  -- Category I - continuous fetal monitoring  -- GBS positive (PCN)  Raelynn Corron S. Juleen China, DO OB/GYN Fellow, Faculty Practice  3:51 AM
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
## 2019-05-02 NOTE — Anesthesia Procedure Notes (Signed)
Epidural Patient location during procedure: OB Start time: 05/02/2019 12:46 AM End time: 05/02/2019 12:49 AM  Staffing Anesthesiologist: Brennan Bailey, MD Performed: anesthesiologist   Preanesthetic Checklist Completed: patient identified, pre-op evaluation, timeout performed, IV checked, risks and benefits discussed and monitors and equipment checked  Epidural Patient position: sitting Prep: site prepped and draped and DuraPrep Patient monitoring: continuous pulse ox, blood pressure, heart rate and cardiac monitor Approach: midline Location: L3-L4 Injection technique: LOR air  Needle:  Needle type: Tuohy  Needle gauge: 17 G Needle length: 9 cm Needle insertion depth: 5 cm Catheter type: closed end flexible Catheter size: 19 Gauge Catheter at skin depth: 10 cm Test dose: negative and Other (1% lidocaine)  Assessment Sensory level: T8 Events: blood not aspirated, injection not painful, no injection resistance and negative IV test  Additional Notes Patient identified. Risks, benefits, and alternatives discussed with patient including but not limited to bleeding, infection, nerve damage, paralysis, failed block, incomplete pain control, headache, blood pressure changes, nausea, vomiting, reactions to medication, itching, and postpartum back pain. Confirmed with bedside nurse the patient's most recent platelet count. Confirmed with patient that they are not currently taking any anticoagulation, have any bleeding history, or any family history of bleeding disorders. Patient expressed understanding and wished to proceed. All questions were answered. Sterile technique was used throughout the entire procedure.   First approach at L3-4 with crisp LOR, pt c/o paraesthesia on left with catheter introduction. Tuohy and catheter withdrawn and re-approached at same level with catheter easily threaded, no paraesthesias. Please see nursing notes for vital signs. Test dose was given through  epidural catheter and negative prior to continuing to dose epidural or start infusion. Warning signs of high block given to the patient including shortness of breath, tingling/numbness in hands, complete motor block, or any concerning symptoms with instructions to call for help. Patient was given instructions on fall risk and not to get out of bed. All questions and concerns addressed with instructions to call with any issues or inadequate analgesia.  Reason for block:procedure for pain

## 2019-05-02 NOTE — Progress Notes (Signed)
OB/GYN Faculty Practice: Labor Progress Note  Subjective: Into room because of recurrent decelerations. RN has been trying position changes, including hands/knees without much improvement.  Objective: BP 139/82   Pulse 77   Temp 97.8 F (36.6 C) (Oral)   Resp 18   Ht 5\' 2"  (1.575 m)   Wt 79.4 kg   LMP 08/12/2018   SpO2 95%   BMI 32.01 kg/m  Gen: comfortable appearing, NAD Dilation: 6.5 Effacement (%): 70 Station: -2 Presentation: Vertex Exam by:: Dr. Danley Danker  Assessment and Plan: 28 y.o. [id number] [redacted]w[redacted]d here for IOL for severe preeclampsia.   Labor: s/p AROM. Recheck SVE and now 7/70/-2 with contractions but more 5-6 in between. Turned pitocin in half and now off because of recurrent decelerations.  -- pain control: epidural in place  -- PPH Risk: medium (Mg++, likely prolonged induction)  Severe Preeclampsia (RUQ Pain): Moderate range BP, asymptomatic at this time. Labs wnl - AST 43, ALT 37, Cr 0.77. Platelets 177. UPC 0.50. Recent Mg++ level 5.0. BP has been normal to moderate range. Adequate UOP, no signs of Mg++ toxicity. Has not required any IV prn anti-hypertensives.  -- continue Mg++  -- continue to monitor closely -- repeat [place] labs in am  Fetal Well-Being: EFW 6-7lbs. Cephalic by sutures on recent check.  -- Category II - continuous fetal monitoring - stopped pitocin, continue to reposition as able  -- GBS positive (PCN)  Casey West S. Juleen China, DO OB/GYN Fellow, Faculty Practice  5:05 AM

## 2019-05-02 NOTE — Anesthesia Preprocedure Evaluation (Addendum)
Anesthesia Evaluation  Patient identified by MRN, date of birth, ID band Patient awake    Reviewed: Allergy & Precautions, Patient's Chart, lab work & pertinent test results  History of Anesthesia Complications Negative for: history of anesthetic complications  Airway Mallampati: II  TM Distance: >3 FB Neck ROM: Full    Dental no notable dental hx.    Pulmonary neg pulmonary ROS,    Pulmonary exam normal        Cardiovascular negative cardio ROS Normal cardiovascular exam     Neuro/Psych negative neurological ROS     GI/Hepatic negative GI ROS, Neg liver ROS,   Endo/Other  negative endocrine ROS  Renal/GU negative Renal ROS     Musculoskeletal negative musculoskeletal ROS (+)   Abdominal   Peds  Hematology negative hematology ROS (+)   Anesthesia Other Findings Day of surgery medications reviewed with the patient.  Reproductive/Obstetrics (+) Pregnancy Severe pre-eclampsia on Mg                             Anesthesia Physical Anesthesia Plan  ASA: III  Anesthesia Plan: Epidural   Post-op Pain Management:    Induction:   PONV Risk Score and Plan: Treatment may vary due to age or medical condition  Airway Management Planned: Natural Airway  Additional Equipment:   Intra-op Plan:   Post-operative Plan:   Informed Consent: I have reviewed the patients History and Physical, chart, labs and discussed the procedure including the risks, benefits and alternatives for the proposed anesthesia with the patient or authorized representative who has indicated his/her understanding and acceptance.       Plan Discussed with:   Anesthesia Plan Comments:         Anesthesia Quick Evaluation

## 2019-05-02 NOTE — Progress Notes (Signed)
OB/GYN Faculty Practice: Labor Progress Note  Subjective: Sitting up in bed, much more uncomfortable, breathing through contractions. Thinking about epidural, scared related to having a C/S.   Objective: BP (!) 140/91   Pulse 97   Temp 98.1 F (36.7 C) (Oral)   Resp 20   Ht 5\' 2"  (1.575 m)   Wt 79.4 kg   LMP 08/12/2018   SpO2 96%   BMI 32.01 kg/m  Gen: tearful, uncomfortable appearing during contractions  Dilation: 5.5 Effacement (%): 90 Station: -3 Presentation: Vertex Exam by:: caitlin nightingal, rN  Assessment and Plan: 28 y.o. T4S5681 [redacted]w[redacted]d here for IOL for severe preeclampsia.   Labor: Cervix much more thin since last check, continuing to titrate pitocin. Planning on getting an epidural. Consider AROM with next check.  -- pain control: epidural - drawing labs now -- PPH Risk: medium (Mg++, likely prolonged induction)  Severe Preeclampsia (RUQ Pain): Moderate range BP, asymptomatic at this time. Labs wnl - AST 43, ALT 37, Cr 0.77. Platelets 177. UPC 0.50. Recent Mg++ level 5.0. BP has been normal to moderate range. Adequate UOP, no signs of Mg++ toxicity. Has not required any IV prn anti-hypertensives.  -- continue Mg++  -- continue to monitor closely -- repeat [place] labs in am  Fetal Well-Being: EFW 6-7lbs. Cephalic by sutures on recent check.  -- Category I - continuous fetal monitoring  -- GBS positive (PCN)  Karielle Davidow S. Juleen China, DO OB/GYN Fellow, Faculty Practice  12:03 AM

## 2019-05-02 NOTE — Plan of Care (Signed)
  Problem: Activity: Goal: Risk for activity intolerance will decrease Outcome: Progressing   Problem: Coping: Goal: Level of anxiety will decrease Outcome: Progressing   Problem: Pain Managment: Goal: General experience of comfort will improve Outcome: Progressing   Problem: Skin Integrity: Goal: Risk for impaired skin integrity will decrease Outcome: Progressing   Problem: Nutrition: Goal: Adequate nutrition will be maintained Outcome: Completed/Met   Problem: Education: Goal: Knowledge of Childbirth will improve Outcome: Completed/Met Goal: Ability to make informed decisions regarding treatment and plan of care will improve Outcome: Completed/Met Goal: Ability to state and carry out methods to decrease the pain will improve Outcome: Completed/Met Goal: Individualized Educational Video(s) Outcome: Completed/Met   Problem: Coping: Goal: Ability to verbalize concerns and feelings about labor and delivery will improve Outcome: Completed/Met   Problem: Life Cycle: Goal: Ability to make normal progression through stages of labor will improve Outcome: Completed/Met Goal: Ability to effectively push during vaginal delivery will improve Outcome: Completed/Met   Problem: Role Relationship: Goal: Will demonstrate positive interactions with the child Outcome: Completed/Met   Problem: Safety: Goal: Risk of complications during labor and delivery will decrease Outcome: Completed/Met   Problem: Pain Management: Goal: Relief or control of pain from uterine contractions will improve Outcome: Completed/Met
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
## 2019-05-02 NOTE — Progress Notes (Signed)
Dr. Jobe Gibbon notified that pt is more swollen now & questionable hematoma on inner right side of vagina.  MD requested to come evaluate.

## 2019-05-02 NOTE — Progress Notes (Signed)
MD in to evaluate ? Hematoma.  Area inside vagina on right side very swollen, tissue looks very thin d/t swelling and is hard.  Pt to get iced glove inside & outside of vagina to help w/ swelling & then will reevaluate later in pt PP room 108
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
## 2019-05-02 NOTE — Progress Notes (Signed)
Interval Progress Note  Plan of care discussed with Dr. Nehemiah Settle. Strip overall now reassuring. Increased baseline 150-160s and now with some variables, some late decelerations. Category II tracing. Called to discuss with RN. Will trial amnioinfusion, continue position changes. Restart pitocin.   Lambert Mody. Juleen China, DO OB/GYN Fellow

## 2019-05-03 MED ORDER — LACTATED RINGERS IV SOLN
INTRAVENOUS | Status: DC
  Administered 2019-05-03 (×3): via INTRAVENOUS

## 2019-05-03 MED ORDER — ENALAPRIL MALEATE 5 MG PO TABS
5.0000 mg | ORAL_TABLET | Freq: Every day | ORAL | Status: DC
  Administered 2019-05-03 – 2019-05-04 (×2): 5 mg via ORAL
  Filled 2019-05-03 (×2): qty 1

## 2019-05-03 MED ORDER — SENNA 8.6 MG PO TABS: 1.0000 | ORAL_TABLET | Freq: Every day | ORAL | Status: DC | PRN

## 2019-05-03 MED ORDER — POLYETHYLENE GLYCOL 3350 17 G PO PACK
17.0000 g | PACK | Freq: Every day | ORAL | Status: DC
  Administered 2019-05-03 – 2019-05-04 (×2): 17 g via ORAL
  Filled 2019-05-03 (×2): qty 1

## 2019-05-03 NOTE — Anesthesia Postprocedure Evaluation (Signed)
Anesthesia Post Note  Patient: Casey West  Procedure(s) Performed: AN AD HOC LABOR EPIDURAL     Anesthesia Type: Epidural Anesthetic complications: no    Last Vitals:  Vitals:   05/03/19 0803 05/03/19 0900  BP: (!) 152/94   Pulse: 91   Resp: 18 18  Temp: 36.8 C   SpO2: 99%     Last Pain:  Vitals:   05/03/19 0900  TempSrc:   PainSc: 0-No pain                 Jaquaya Coyle A.

## 2019-05-03 NOTE — Progress Notes (Addendum)
7/1 @2140  Dr Elonda Husky at bedside to assess hematoma. Dr Elonda Husky inserted foley catheter and ordered for its removal 4 hrs post magnesium sulfate.

## 2019-05-03 NOTE — Lactation Note (Signed)
This note was copied from a baby's chart. Lactation Consultation Note Baby 58 hrs old. Mom concerned how much colostrum the baby is getting. Mom has anxiety issues and is voicing concerned about a lot of things. LPI information sheet given to follow d/t baby 5.5lbs at birth. Mom repeatedly saying she feels she needs to supplement. LC explained that yes she does need to supplement.  Mom has used DEBP pump 5 ml colostrum. Praised mom.  Discussed pumping every 3 hrs, giving baby colostrum first, then if baby needs more supplement mom is to use Similac 22 cal. Mom in agreement.  In football position placed baby. Assisted in latching. Taught mom to stroke baby's top lip w/her everted nipple to get baby to open wide. Discussed positioning, support, safety while feeding, breast massage, STS, I&O, supply and demand.  Baby BF well. Heard swallows. Praised mom. Supplemented w/BM. Mom gave some of the colostrum then LC finished giving colostrum. Reviewed again supplementing w/BM then colostrum until can give amount needed according to hours of age. Colostrum given, baby chewed on nipple w/burst of suckling. Has uncoordinated suck swallow coordination.  [name] left to go to work. Mom very sleepy from Mag drip. RN discussed w/mom baby going to the CN while she rested. Mom agreed. LC verified arm band w/mom's. LC transported baby to CN.  LC spent substantial amount of time on positioning, feeding, pumping,and supplementing. Mom appears to be clueless about things, mom looks at you as if she's having trouble processing.  Lactation brochure given.  Patient Name: Casey West [id number] Date: 05/03/2019 Reason for consult: Initial assessment;1st time breastfeeding;Infant < 6lbs;Term   Maternal Data Has patient been taught Hand Expression?: Yes Does the patient have breastfeeding experience prior to this delivery?: No  Feeding Feeding Type: Breast Milk(Simultaneous filing. User may not have seen  previous data.) Nipple Type: Slow - flow  LATCH Score Latch: Grasps breast easily, tongue down, lips flanged, rhythmical sucking.  Audible Swallowing: Spontaneous and intermittent  Type of Nipple: Everted at rest and after stimulation  Comfort (Breast/Nipple): Soft / non-tender  Hold (Positioning): Assistance needed to correctly position infant at breast and maintain latch.  LATCH Score: 9  Interventions Interventions: Breast feeding basics reviewed;Adjust position;DEBP;Assisted with latch;Support pillows;Skin to skin;Position options;Breast massage;Expressed milk;Hand express;Breast compression  Lactation Tools Discussed/Used Tools: Pump Breast pump type: Double-Electric Breast Pump Pump Review: Setup, frequency, and cleaning;Milk Storage Initiated by:: RN/LC Date initiated:: 05/02/19   Consult Status Consult Status: Follow-up Date: 05/04/19 Follow-up type: In-patient    Theodoro Kalata 05/03/2019, 4:33 AM

## 2019-05-03 NOTE — Progress Notes (Signed)
I received a referral from pt's nurse related to pt being very anxious.  I introduced myself and the pt requested that I come back at a later time because her magnesium had just been turned off and she still was feeling the effects of it.    [place], [place] Pager, [phone] 3:42 PM

## 2019-05-03 NOTE — Lactation Note (Signed)
This note was copied from a baby's chart. Lactation Consultation Note  Patient Name: Girl December Hedtke QIONG'E Date: 05/03/2019 Reason for consult: Follow-up assessment;Infant < 6lbs;Primapara;1st time breastfeeding  9528 - 4132 - I followed up with Ms. Sabbagh. She just completed her course of magnesium, and she's beginning to become a bit more alert. She has been pumping using her DEBP today every two hours. She is practicing breast feeding her daughter, but she'd like some assistance with that at our next visit. Baby is being supplemented due to birth weight. I explained rationale for supplementation based on weight, and parents verbalized understanding.  Ms. Hook has a manual pump at home. We discussed how to obtain a DEBP on discharge. Dad will call [employer] to see if it's covered in his plan. I will also call [facility] to see if they are open tomorrow for a pump pick-up. If not, we will do a [facility] rental tomorrow with discharge.  I showed mom and dad pump settings. She was using the expression phase, and I changed her to the initiation phase. She was also using 24 flanges, and 27 flanges seem to be a better fit. She has evert nipples, and her breasts are slightly widely spaced. Dad states that Ms. Delsignore has been lactating with her previous pregnancies even after she medically terminated them. They were reassured to know that this can be a normal occurrence.  Mom and dad are eager to learn. Mom is interested in a follow up OP appointment, and I will put in a referral for this. I offered to return tomorrow for breast feeding practice. Mom states that she has been very "out of it" with the magnesium intervention and admittedly does not remember much of what was said to her in the last 24 hours.  Is showed them the [facility] guide booklet in their folder and also reviewed milk storage guidelines and cleaning recommendations. Dad had a basin of water, and I instructed him to just wash parts but to air  dry on paper towels and not soak the pump equipment.  Both parents asked questions, and we spent time discussed breast feeding basics. Follow up tomorrow.  Maternal Data Formula Feeding for Exclusion: No Does the patient have breastfeeding experience prior to this delivery?: No   Interventions Interventions: Breast feeding basics reviewed;DEBP  Lactation Tools Discussed/Used Tools: Pump;Flanges Flange Size: 27 Breast pump type: Double-Electric Breast Pump WIC Program: Yes Pump Review: Setup, frequency, and cleaning;Milk Storage   Consult Status Consult Status: Follow-up Date: 05/04/19 Follow-up type: In-patient    Lenore Manner 05/03/2019, 4:17 PM

## 2019-05-03 NOTE — Progress Notes (Signed)
Post Partum Day 1 on magnesium for severe pre eclampsia Subjective: no complaints NO headache or visual changes Objective: Blood pressure 137/89, pulse 72, temperature 98.3 F (36.8 C), temperature source Oral, resp. rate 17, height 5\' 2"  (1.575 m), weight 79.4 kg, last menstrual period 08/12/2018, SpO2 99 %, unknown if currently breastfeeding.  Vitals:   05/02/19 1805 05/02/19 2002 05/02/19 2324 05/03/19 0410  BP:  (!) 144/94 128/88 137/89  Pulse:  (!) 101 85 72  Resp: 18 17 18 17   Temp:   98.5 F (36.9 C) 98.3 F (36.8 C)  TempSrc:   Oral Oral  SpO2:  97% 98% 99%  Weight:      Height:        Physical Exam:  General: alert, cooperative and no distress Lochia: appropriate Uterine Fundus: firm Foley in due to bladder atony pp, has severe vulvovaginal bruising which is contributing, not really a hematoma Incision:  DVT Evaluation: No evidence of DVT seen on physical exam. DTRs 2+ Recent Labs    05/02/19 0000 05/02/19 1737  HGB 14.4 13.4  HCT 39.3 37.4    Assessment/Plan: Plan for discharge tomorrow and Breastfeeding  Magnesium finishes up today at 1500, remove foley a couple of hours later >vasotec 5 mg started   LOS: 2 days   Florian Buff 05/03/2019, 7:15 AM

## 2019-05-04 ENCOUNTER — Ambulatory Visit: Payer: Self-pay

## 2019-05-04 ENCOUNTER — Encounter (HOSPITAL_COMMUNITY): Payer: Self-pay

## 2019-05-04 MED ORDER — IBUPROFEN 600 MG PO TABS: 600.0000 mg | ORAL_TABLET | Freq: Four times a day (QID) | ORAL | 0 refills | Status: DC | PRN

## 2019-05-04 MED ORDER — ENALAPRIL MALEATE 5 MG PO TABS: 5.0000 mg | ORAL_TABLET | Freq: Every day | ORAL | 1 refills | Status: DC

## 2019-05-04 NOTE — Lactation Note (Signed)
This note was copied from a baby's chart. Lactation Consultation Note  Patient Name: Casey West CVELF'Y Date: 05/04/2019 Reason for consult: Follow-up assessment;Mother's request;Term;Infant < 6lbs   1250 - 1338 - I was paged to Ms. Gershman's room to help with creating a discharge feeding plan.  Mom has been syringe feeding baby and needs to learn how to provide a bottle for discharge.  We began with a similac slow flow nipple. Baby has a wonderfully strong suck with a gloved finger, but it took some attempts to get her to open her mouth for a bottle. It has been four hours since the previous feeding. Once I was able to insert the nipple, I showed mom how to hold the bottle and how to encourage baby to open wide all the way to the base of the nipple.  Baby began to suckle and took 10 mls. She then began to hiccup and spit up a bit, and mom sat her up to burp her. Speech Casey West) entered the room and began to work with mom and dad on showing them how to pace bottle feed and ultimately found the Tommie Tipple wide mouth nipple worked best.  I reviewed the discharge plan. Baby can latch, but parents are concerned about baby getting enough at the breast. They have a pediatric follow up on Monday in [place].   I wrote down a feeding plan and output expectations for the weekend. I recommended breast feeding baby first, and supplementing after (supplementation guidelines provided). Mom is then to pump, and I recommended that she make a goal to pump 5-8 times/day (depending on quality of breast feed - we discussed indicators of good transfer). Milk storage guidelines were reviewed.  I provided a [facility] loaner pump to parents. [place] WIC is closed today for the holiday and will reopen on Monday. I encouraged parents to call them at the beginning of next week about obtaining a DEBP.  We discussed OP resources via [facility] and [facility]. Mom and dad live in [place] and may find the [facility] resources more  convenient, but I did put in an OP follow up call request for Ms. Meadowcroft.  I recommended that on Monday after her ped appointment, she should have follow up to revise her feeding plan. Mom's goal is to exclusively breast feed. Baby has a good suck, and is primarily receiving supplementation due to birth weight. Mom needs guidance on how to safely transition to exclusive breast feeding, if possible. We did not have an opportunity to observe a feeding due to other education and timing of visits. Mom was not in a cognitive state for education prior to yesterday due to her own medical treatments.  Ms. Votaw presents as being a bit anxious, but she and her significant other were very enthusiastic for education and asked great questions. I tried to write information down to help reduce information overload. I also shared our lactation number and encouraged her to feel free to call us anytime she has a question. She verbalized understanding.   Maternal Data Formula Feeding for Exclusion: No  Feeding Feeding Type: Other (comment)(bottle fed breast milk and formula) Nipple Type: Other(Tommie Tippie wide mouth)  Lactation Tools Discussed/Used WIC Program: Yes Pump Review: Setup, frequency, and cleaning;Milk Storage   Consult Status Consult Status: Complete Follow-up type: Out-patient    Lenore Manner 05/04/2019, 4:09 PM
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
## 2019-05-04 NOTE — Progress Notes (Addendum)
Patient discharged home with infant and significant other. Follow-up care reviewed; medications discussed; discharge instructions reviewed; admission discussed; prescriptions reviewed; pain management discussed; educated about hypertension in pregnancy. MMR and rubella status discussed; pt states she will follow-up at her postpartum visit. Pt verbalized understanding.

## 2019-05-04 NOTE — Discharge Instructions (Signed)
Postpartum Hypertension °Postpartum hypertension is high blood pressure that remains higher than normal after childbirth. You may not realize that you have postpartum hypertension if your blood pressure is not being checked regularly. In most cases, postpartum hypertension will go away on its own, usually within a week of delivery. However, for some women, medical treatment is required to prevent serious complications, such as seizures or stroke. °What are the causes? °This condition may be caused by one or more of the following: °· Hypertension that existed before pregnancy (chronic hypertension). °· Hypertension that comes on as a result of pregnancy (gestational hypertension). °· Hypertensive disorders during pregnancy (preeclampsia) or seizures in women who have high blood pressure during pregnancy (eclampsia). °· A condition in which the liver, platelets, and red blood cells are damaged during pregnancy (HELLP syndrome). °· A condition in which the thyroid produces too much hormones (hyperthyroidism). °· Other rare problems of the nerves (neurological disorders) or blood disorders. °In some cases, the cause may not be known. °What increases the risk? °The following factors may make you more likely to develop this condition: °· Chronic hypertension. In some cases, this may not have been diagnosed before pregnancy. °· Obesity. °· Type 2 diabetes. °· Kidney disease. °· History of preeclampsia or eclampsia. °· Other medical conditions that change the level of hormones in the body (hormonal imbalance). °What are the signs or symptoms? °As with all types of hypertension, postpartum hypertension may not have any symptoms. Depending on how high your blood pressure is, you may experience: °· Headaches. These may be mild, moderate, or severe. They may also be steady, constant, or sudden in onset (thunderclap headache). °· Changes in your ability to see (visual changes). °· Dizziness. °· Shortness of breath. °· Swelling  of your hands, feet, lower legs, or face. In some cases, you may have swelling in more than one of these locations. °· Heart palpitations or a racing heartbeat. °· Difficulty breathing while lying down. °· Decrease in the amount of urine that you pass. °Other rare signs and symptoms may include: °· Sweating more than usual. This lasts longer than a few days after delivery. °· Chest pain. °· Sudden dizziness when you get up from sitting or lying down. °· Seizures. °· Nausea or vomiting. °· Abdominal pain. °How is this diagnosed? °This condition may be diagnosed based on the results of a physical exam, blood pressure measurements, and blood and urine tests. °You may also have other tests, such as a CT scan or an MRI, to check for other problems of postpartum hypertension. °How is this treated? °If blood pressure is high enough to require treatment, your options may include: °· Medicines to reduce blood pressure (antihypertensives). Tell your health care provider if you are breastfeeding or if you plan to breastfeed. There are many antihypertensive medicines that are safe to take while breastfeeding. °· Stopping medicines that may be causing hypertension. °· Treating medical conditions that are causing hypertension. °· Treating the complications of hypertension, such as seizures, stroke, or kidney problems. °Your health care provider will also continue to monitor your blood pressure closely until it is within a safe range for you. °Follow these instructions at home: °· Take over-the-counter and prescription medicines only as told by your health care provider. °· Return to your normal activities as told by your health care provider. Ask your health care provider what activities are safe for you. °· Do not use any products that contain nicotine or tobacco, such as cigarettes and e-cigarettes. If   you need help quitting, ask your health care provider. °· Keep all follow-up visits as told by your health care provider. This  is important. °Contact a health care provider if: °· Your symptoms get worse. °· You have new symptoms, such as: °? A headache that does not get better. °? Dizziness. °? Visual changes. °Get help right away if: °· You suddenly develop swelling in your hands, ankles, or face. °· You have sudden, rapid weight gain. °· You develop difficulty breathing, chest pain, racing heartbeat, or heart palpitations. °· You develop severe pain in your abdomen. °· You have any symptoms of a stroke. "BE FAST" is an easy way to remember the main warning signs of a stroke: °? B - Balance. Signs are dizziness, sudden trouble walking, or loss of balance. °? E - Eyes. Signs are trouble seeing or a sudden change in vision. °? F - Face. Signs are sudden weakness or numbness of the face, or the face or eyelid drooping on one side. °? A - Arms. Signs are weakness or numbness in an arm. This happens suddenly and usually on one side of the body. °? S - Speech. Signs are sudden trouble speaking, slurred speech, or trouble understanding what people say. °? T - Time. Time to call emergency services. Write down what time symptoms started. °· You have other signs of a stroke, such as: °? A sudden, severe headache with no known cause. °? Nausea or vomiting. °? Seizure. °These symptoms may represent a serious problem that is an emergency. Do not wait to see if the symptoms will go away. Get medical help right away. Call your local emergency services (911 in the [place] Do not drive yourself to the hospital. °Summary °· Postpartum hypertension is high blood pressure that remains higher than normal after childbirth. °· In most cases, postpartum hypertension will go away on its own, usually within a week of delivery. °· For some women, medical treatment is required to prevent serious complications, such as seizures or stroke. °This information is not intended to replace advice given to you by your health care provider. Make sure you discuss any questions  you have with your health care provider. °Document Released: 06/21/2014 Document Revised: 11/24/2018 Document Reviewed: 08/08/2017 °Elsevier Patient Education © 2020 Elsevier Inc. ° °

## 2019-05-04 NOTE — Progress Notes (Signed)
Pt was feeling much better physically and emotionally today and was grateful for [place] checking in on her.  They are looking forward to going home soon.  They will have the support of her mother when they are at home. They seemed to be in good spirits and interacting well together as they cared for their baby.  [place], [place] Pager [phone] 1:31 PM    05/04/19 1300  Clinical Encounter Type  Visited With Patient and family together  Visit Type Follow-up

## 2019-05-08 ENCOUNTER — Ambulatory Visit: Payer: BC Managed Care – PPO | Admitting: *Deleted

## 2019-05-08 ENCOUNTER — Other Ambulatory Visit: Payer: Self-pay

## 2019-05-08 VITALS — BP 120/86 | HR 74

## 2019-05-08 DIAGNOSIS — Z0130 Encounter for examination of blood pressure without abnormal findings: Secondary | ICD-10-CM

## 2019-05-10 ENCOUNTER — Encounter (HOSPITAL_COMMUNITY): Payer: Self-pay

## 2019-05-10 ENCOUNTER — Encounter (HOSPITAL_COMMUNITY): Payer: Self-pay | Admitting: Emergency Medicine

## 2019-05-22 DIAGNOSIS — F4310 Post-traumatic stress disorder, unspecified: Secondary | ICD-10-CM | POA: Diagnosis not present

## 2019-06-01 ENCOUNTER — Telehealth: Payer: Self-pay | Admitting: Women's Health

## 2019-06-01 NOTE — Telephone Encounter (Signed)

## 2019-06-04 ENCOUNTER — Ambulatory Visit: Payer: BC Managed Care – PPO | Admitting: Women's Health

## 2019-06-05 ENCOUNTER — Telehealth: Payer: Self-pay | Admitting: Women's Health

## 2019-06-05 NOTE — Telephone Encounter (Signed)

## 2019-06-06 ENCOUNTER — Other Ambulatory Visit: Payer: Self-pay

## 2019-06-06 ENCOUNTER — Encounter: Payer: Self-pay | Admitting: Women's Health

## 2019-06-06 ENCOUNTER — Ambulatory Visit (INDEPENDENT_AMBULATORY_CARE_PROVIDER_SITE_OTHER): Payer: Medicaid Other | Admitting: Women's Health

## 2019-06-06 NOTE — Progress Notes (Signed)
POSTPARTUM VISIT Patient name: Casey West MRN [id number]  Date of birth: [birth year] Chief Complaint:   Postpartum Care  History of Present Illness:   Casey West is a 28 y.o. [phone] Caucasian female being seen today for a postpartum visit. She is 5 weeks postpartum following a spontaneous vaginal delivery at 39.0 gestational weeks after IOL for severe pre-e. Anesthesia: epidural. Laceration: 1st degree, hemostatic w/o repair. I have fully reviewed the prenatal and intrapartum course. Pregnancy complicated by severe pre-e, pnc in [place], transferred to birth center in [place]. D/C'd on vasotec 5mg  daily, still taking.  Postpartum course has been uncomplicated. Bleeding spotting. Bowel function is normal. Bladder function is normal.  Patient is not sexually active. Last sexual activity: prior to birth of baby.  Contraception method is thinking about BTL vs. depo, wants to continue thinking.    Last pap 2019.  Results were normal at [facility] in [place] No LMP recorded. (Menstrual status: Other).  Baby's course has been uncomplicated. Baby is feeding by breast    Flavia Shipper Postpartum Depression Screening: positive, denies SI/HI/II. H/O dep/anx, was on celexa in past which worked well, thinks she's doing ok right now, will let us know if she changes her mind.   Edinburgh Postnatal Depression Scale - 06/06/19 1511      Edinburgh Postnatal Depression Scale:  In the Past 7 Days   I have been able to laugh and see the funny side of things.  1    I have looked forward with enjoyment to things.  1    I have blamed myself unnecessarily when things went wrong.  2    I have been anxious or worried for no good reason.  2    I have felt scared or panicky for no good reason.  2    Things have been getting on top of me.  2    I have been so unhappy that I have had difficulty sleeping.  2    I have felt sad or miserable.  2    I have been so unhappy that I have been crying.  1    The thought of  harming myself has occurred to me.  0    Edinburgh Postnatal Depression Scale Total  15      Review of Systems:   Pertinent items are noted in HPI Denies Abnormal vaginal discharge w/ itching/odor/irritation, headaches, visual changes, shortness of breath, chest pain, abdominal pain, severe nausea/vomiting, or problems with urination or bowel movements. Pertinent History Reviewed:  Reviewed past medical,surgical, obstetrical and family history.  Reviewed problem list, medications and allergies. OB History  Gravida Para Term Preterm AB Living  6 1 1  0 4 1  SAB TAB Ectopic Multiple Live Births  0 4 0   1    # Outcome Date GA Lbr Len/2nd Weight Sex Delivery Anes PTL Lv  6 Gravida           5 Term 05/02/19 [redacted]w[redacted]d 12:59 / 00:18 5 lb 5.5 oz (2.425 kg) F Vag-Spont EPI  LIV  4 TAB           3 TAB           2 TAB           1 TAB            Physical Assessment:   Vitals:   06/06/19 1508  BP: (!) 85/57  Pulse: 81  Weight: 150 lb (68 kg)  Height: 5\' 2"  (1.575  m)  Body mass index is 27.44 kg/m.       Physical Examination:   General appearance: alert, well appearing, and in no distress  Mental status: alert, oriented to person, place, and time  Skin: warm & dry   Cardiovascular: normal heart rate noted   Respiratory: normal respiratory effort, no distress   Breasts: deferred, no complaints   Abdomen: soft, non-tender   Pelvic: examination not indicated  Rectal: *not examined  Extremities: no edema       No results found for this or any previous visit (from the past 24 hour(s)).  Assessment & Plan:  1) Postpartum exam 2) 5 wks s/p SVB after IOL for severe pre-e 3) Breastfeeding 4) Depression screening 5) Contraception counseling, pt prefers to continue thinking, BTL vs depo, discussed LARCs just as effective. Condoms if sexually active 6) Dep/anx> declines meds right now, will let us know if she changes her mind 7) Resolved HTN> stop Vasotec, f/u Monday for bp check w/ nurse   Meds: No orders of the defined types were placed in this encounter.   Follow-up: Return for monday for bp check w/ nurse; get pap [place] from [place]   No orders of the defined types were placed in this encounter.   Cheral MarkerKimberly R Barnabas Henriques CNM, [facility]NP-BC 06/06/2019 3:36 PM

## 2019-06-06 NOTE — Patient Instructions (Signed)
Tips To Increase Milk Supply  Lots of water! Enough so that your urine is clear  Plenty of calories, if you're not getting enough calories, your milk supply can decrease  Breastfeed/pump often, every 2-3 hours x 20-7930mins  Fenugreek 3 pills 3 times a day, this may make your urine smell like maple syrup  Mother's Milk Tea  Lactation cookies, google for the recipe  Real oatmeal  Let me know if you decide for celexa or birth control   Perinatal Depression When a woman feels excessive sadness, anger, or anxiety during pregnancy or during the first 12 months after she gives birth, she has a condition called perinatal depression. Depression can interfere with work, school, relationships, and other everyday activities. If it is not managed properly, it can also cause problems in the mother and her baby. Sometimes, perinatal depression is left untreated because symptoms are thought to be normal mood swings during and right after pregnancy. If you have symptoms of depression, it is important to talk with your health care provider. What are the causes? The exact cause of this condition is not known. Hormonal changes during and after pregnancy may play a role in causing perinatal depression. What increases the risk? You are more likely to develop this condition if:  You have a personal or family history of depression, anxiety, or mood disorders.  You experience a stressful life event during pregnancy, such as the death of a loved one.  You have a lot of regular life stress.  You do not have support from family members or loved ones, or you are in an abusive relationship. What are the signs or symptoms? Symptoms of this condition include:  Feeling sad or hopeless.  Feelings of guilt.  Feeling irritable or overwhelmed.  Changes in your appetite.  Lack of energy or motivation.  Sleep problems.  Difficulty concentrating or completing tasks.  Loss of interest in hobbies or  relationships.  Headaches or stomach problems that do not go away. How is this diagnosed? This condition is diagnosed based on a physical exam and mental evaluation. In some cases, your health care provider may use a depression screening tool. These tools include a list of questions that can help a health care provider diagnose depression. Your health care provider may refer you to a mental health expert who specializes in depression. How is this treated? This condition may be treated with:  Medicines. Your health care provider will only give you medicines that have been proven safe for pregnancy and breastfeeding.  Talk therapy with a mental health professional to help change your patterns of thinking (cognitive behavioral therapy).  Support groups.  Brain stimulation or light therapies.  Stress reduction therapies, such as mindfulness. Follow these instructions at home: Lifestyle  Do not use any products that contain nicotine or tobacco, such as cigarettes and e-cigarettes. If you need help quitting, ask your health care provider.  Do not use alcohol when you are pregnant. After your baby is born, limit alcohol intake to no more than 1 drink a day. One drink equals 12 oz of beer, 5 oz of wine, or 1 oz of hard liquor.  Consider joining a support group for new mothers. Ask your health care provider for recommendations.  Take good care of yourself. Make sure you: ? Get plenty of sleep. If you are having trouble sleeping, talk with your health care provider. ? Eat a healthy diet. This includes plenty of fruits and vegetables, whole grains, and lean proteins. ?  Exercise regularly, as told by your health care provider. Ask your health care provider what exercises are safe for you. General instructions  Take over-the-counter and prescription medicines only as told by your health care provider.  Talk with your partner or family members about your feelings during pregnancy. Share any  concerns or anxieties that you may have.  Ask for help with tasks or chores when you need it. Ask friends and family members to provide meals, watch your children, or help with cleaning.  Keep all follow-up visits as told by your health care provider. This is important. Contact a health care provider if:  You (or people close to you) notice that you have any symptoms of depression.  You have depression and your symptoms get worse.  You experience side effects from medicines, such as nausea or sleep problems. Get help right away if:  You feel like hurting yourself, your baby, or someone else. If you ever feel like you may hurt yourself or others, or have thoughts about taking your own life, get help right away. You can go to your nearest emergency department or call:  Your local emergency services (911 in the [place]  A suicide crisis helpline, such as the [facility] at 215-748-0779. This is open 24 hours a day. Summary  Perinatal depression is when a woman feels excessive sadness, anger, or anxiety during pregnancy or during the first 12 months after she gives birth.  If perinatal depression is not treated, it can lead to health problems for the mother and her baby.  This condition is treated with medicines, talk therapy, stress reduction therapies, or a combination of two or more treatments.  Talk with your partner or family members about your feelings. Do not be afraid to ask for help. This information is not intended to replace advice given to you by your health care provider. Make sure you discuss any questions you have with your health care provider. Document Released: 12/15/2016 Document Revised: 10/21/2017 Document Reviewed: 12/15/2016 Elsevier Patient Education  2020 [employer].

## 2019-06-07 ENCOUNTER — Encounter: Payer: Self-pay | Admitting: Women's Health

## 2019-06-07 ENCOUNTER — Telehealth: Payer: Self-pay | Admitting: Obstetrics & Gynecology

## 2019-06-07 DIAGNOSIS — Z124 Encounter for screening for malignant neoplasm of cervix: Secondary | ICD-10-CM | POA: Insufficient documentation

## 2019-06-07 NOTE — Telephone Encounter (Signed)

## 2019-06-11 ENCOUNTER — Ambulatory Visit (INDEPENDENT_AMBULATORY_CARE_PROVIDER_SITE_OTHER): Payer: BC Managed Care – PPO | Admitting: *Deleted

## 2019-06-11 ENCOUNTER — Other Ambulatory Visit: Payer: Self-pay

## 2019-06-11 VITALS — BP 95/64 | HR 86

## 2019-06-11 DIAGNOSIS — Z0130 Encounter for examination of blood pressure without abnormal findings: Secondary | ICD-10-CM

## 2019-06-11 NOTE — Progress Notes (Signed)
Patient stopped Vasotec 06/07/2019.

## 2019-06-12 ENCOUNTER — Telehealth: Payer: Self-pay | Admitting: Obstetrics and Gynecology

## 2019-06-12 NOTE — Telephone Encounter (Signed)

## 2019-06-13 ENCOUNTER — Ambulatory Visit (INDEPENDENT_AMBULATORY_CARE_PROVIDER_SITE_OTHER): Payer: Medicaid Other

## 2019-06-13 ENCOUNTER — Other Ambulatory Visit: Payer: Self-pay | Admitting: Advanced Practice Midwife

## 2019-06-13 ENCOUNTER — Other Ambulatory Visit: Payer: Self-pay

## 2019-06-13 ENCOUNTER — Ambulatory Visit: Payer: BC Managed Care – PPO

## 2019-06-13 VITALS — BP 95/60 | HR 86 | Ht 62.0 in | Wt 150.0 lb

## 2019-06-13 DIAGNOSIS — Z3042 Encounter for surveillance of injectable contraceptive: Secondary | ICD-10-CM

## 2019-06-13 MED ORDER — MEDROXYPROGESTERONE ACETATE 150 MG/ML IM SUSP: 150.0000 mg | INTRAMUSCULAR | 3 refills | Status: DC

## 2019-06-13 MED ORDER — MEDROXYPROGESTERONE ACETATE 150 MG/ML IM SUSP
150.0000 mg | Freq: Once | INTRAMUSCULAR | Status: AC
  Administered 2019-06-13: 150 mg via INTRAMUSCULAR

## 2019-06-13 NOTE — Progress Notes (Signed)
Depo sent to pharmacy

## 2019-06-13 NOTE — Progress Notes (Signed)
Pt here for 1st depo injection 150 mg IM  Given lt deltoid. Tolerated well. Return 12 weeks for next injection.Pad CMA

## 2019-06-14 ENCOUNTER — Ambulatory Visit: Payer: BC Managed Care – PPO

## 2019-06-14 ENCOUNTER — Telehealth: Payer: Self-pay | Admitting: Advanced Practice Midwife

## 2019-06-14 NOTE — Telephone Encounter (Signed)
Pt is wanting medication for postpartum depression. States her therapist is out of town for 3 weeks and she can not get the medication from her.

## 2019-06-14 NOTE — Telephone Encounter (Signed)
Unable to reach pt to schedule. Will await call back. Spoke with Estill Bamberg, may get pt in next Tuesday with [facility].

## 2019-07-04 DIAGNOSIS — F4310 Post-traumatic stress disorder, unspecified: Secondary | ICD-10-CM | POA: Diagnosis not present

## 2019-07-05 DIAGNOSIS — F329 Major depressive disorder, single episode, unspecified: Secondary | ICD-10-CM | POA: Diagnosis not present

## 2019-07-12 ENCOUNTER — Telehealth: Payer: Self-pay | Admitting: *Deleted

## 2019-07-12 NOTE — Telephone Encounter (Signed)
Pt don't want to do anything, pt don't want to take care of baby. Has noticed these symptoms since end of July. Pt don't feel suicidal or that she could harm anyone else. Pt is short tempered. Pt is not breastfeeding now. Pt sees a therapist that sent her to a psychiatrist. I spoke with FCD, CNM who advised she needs to reach out to her psychiatrist. Pt voiced understanding. [place]

## 2019-07-12 NOTE — Telephone Encounter (Signed)
Wants to speak with someone regarding postpartum depression.

## 2019-07-24 ENCOUNTER — Other Ambulatory Visit: Payer: Self-pay

## 2019-07-24 ENCOUNTER — Ambulatory Visit (INDEPENDENT_AMBULATORY_CARE_PROVIDER_SITE_OTHER): Payer: Medicaid Other | Admitting: Psychiatry

## 2019-07-24 ENCOUNTER — Encounter (HOSPITAL_COMMUNITY): Payer: Self-pay | Admitting: Psychiatry

## 2019-07-24 VITALS — Wt 140.0 lb

## 2019-07-24 DIAGNOSIS — F4310 Post-traumatic stress disorder, unspecified: Secondary | ICD-10-CM | POA: Diagnosis not present

## 2019-07-24 DIAGNOSIS — F53 Postpartum depression: Secondary | ICD-10-CM | POA: Diagnosis not present

## 2019-07-24 DIAGNOSIS — O99345 Other mental disorders complicating the puerperium: Secondary | ICD-10-CM

## 2019-07-24 MED ORDER — LAMOTRIGINE 25 MG PO TABS: ORAL_TABLET | ORAL | 1 refills | Status: DC
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
## 2019-08-13 ENCOUNTER — Telehealth (HOSPITAL_COMMUNITY): Payer: Self-pay

## 2019-08-13 NOTE — Telephone Encounter (Signed)
Patient called to report a rash after starting the Lamictal, she states that it is not red, just raised bumps and it is dry. Patient states that is seems to get better when she drinks more water and applies lotion. She is not sure if it is actually from Lamictal. I have advised patient to stop the medication until she hears back from one of [place].

## 2019-08-13 NOTE — Telephone Encounter (Signed)
Yes she can discontinue Lamictal and watch carefully if the rash resolved.  If it is due to Lamictal rash should be resolved.  We can try Abilify 5 mg half to 1 tablet to take daily once rash completely resolved.

## 2019-08-20 ENCOUNTER — Encounter (HOSPITAL_COMMUNITY): Payer: Self-pay | Admitting: Psychiatry

## 2019-08-20 ENCOUNTER — Ambulatory Visit (INDEPENDENT_AMBULATORY_CARE_PROVIDER_SITE_OTHER): Payer: BC Managed Care – PPO | Admitting: Psychiatry

## 2019-08-20 ENCOUNTER — Other Ambulatory Visit: Payer: Self-pay

## 2019-08-20 VITALS — Wt 150.0 lb

## 2019-08-20 DIAGNOSIS — F4310 Post-traumatic stress disorder, unspecified: Secondary | ICD-10-CM | POA: Diagnosis not present

## 2019-08-20 DIAGNOSIS — F319 Bipolar disorder, unspecified: Secondary | ICD-10-CM

## 2019-08-20 MED ORDER — ARIPIPRAZOLE 5 MG PO TABS: ORAL_TABLET | ORAL | 1 refills | Status: DC

## 2019-08-20 NOTE — Telephone Encounter (Signed)
Called patient and got the voicemail, I will try again later today
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
## 2019-08-22 NOTE — Telephone Encounter (Signed)
Called patient again today, no answer, left message

## 2019-09-04 ENCOUNTER — Telehealth: Payer: Self-pay | Admitting: Obstetrics & Gynecology

## 2019-09-04 NOTE — Telephone Encounter (Signed)

## 2019-09-05 ENCOUNTER — Ambulatory Visit: Payer: Medicaid Other

## 2019-09-07 ENCOUNTER — Other Ambulatory Visit (HOSPITAL_COMMUNITY): Payer: Self-pay

## 2019-09-07 MED ORDER — CITALOPRAM HYDROBROMIDE 20 MG PO TABS: 20.0000 mg | ORAL_TABLET | Freq: Every day | ORAL | 0 refills | Status: DC

## 2019-09-11 ENCOUNTER — Telehealth: Payer: Self-pay | Admitting: Obstetrics & Gynecology

## 2019-09-11 NOTE — Telephone Encounter (Signed)

## 2019-09-12 ENCOUNTER — Ambulatory Visit: Payer: BC Managed Care – PPO

## 2019-09-25 ENCOUNTER — Encounter (HOSPITAL_COMMUNITY): Payer: Self-pay | Admitting: Psychiatry

## 2019-09-25 ENCOUNTER — Other Ambulatory Visit: Payer: Self-pay

## 2019-09-25 ENCOUNTER — Ambulatory Visit (INDEPENDENT_AMBULATORY_CARE_PROVIDER_SITE_OTHER): Payer: Medicaid Other | Admitting: Psychiatry

## 2019-09-25 DIAGNOSIS — F4310 Post-traumatic stress disorder, unspecified: Secondary | ICD-10-CM

## 2019-09-25 DIAGNOSIS — F319 Bipolar disorder, unspecified: Secondary | ICD-10-CM

## 2019-09-25 MED ORDER — CITALOPRAM HYDROBROMIDE 20 MG PO TABS: 20.0000 mg | ORAL_TABLET | Freq: Every day | ORAL | 0 refills | Status: DC

## 2019-09-25 MED ORDER — ARIPIPRAZOLE 5 MG PO TABS: 5.0000 mg | ORAL_TABLET | Freq: Every day | ORAL | 0 refills | Status: DC
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
## 2019-10-01 DIAGNOSIS — F4310 Post-traumatic stress disorder, unspecified: Secondary | ICD-10-CM | POA: Diagnosis not present

## 2019-10-08 ENCOUNTER — Telehealth: Payer: Self-pay | Admitting: *Deleted

## 2019-10-08 DIAGNOSIS — F4310 Post-traumatic stress disorder, unspecified: Secondary | ICD-10-CM | POA: Diagnosis not present

## 2019-10-08 NOTE — Telephone Encounter (Signed)
Pt took 1 shot of Depo. Pt's Depo was due Nov.4. She didn't continue Depo due to it causing her to be hormonal. Pt has been on period x 3 weeks. Bleeding goes from being heavy to light and then starts over. Pt was advised to do a home pregnancy test. I advised to take a home pregnancy test. I advised bleeding could be off from starting a new birth control then stopping it. Pt was advised to let us know if pregnancy test was positive or if bleeding continues. Pt voiced understanding. Wilcox

## 2019-10-08 NOTE — Telephone Encounter (Signed)
Pt left message that she has been having her period for a month now. Wants to know why?

## 2019-10-12 DIAGNOSIS — F331 Major depressive disorder, recurrent, moderate: Secondary | ICD-10-CM | POA: Diagnosis not present

## 2019-10-12 DIAGNOSIS — Z6828 Body mass index (BMI) 28.0-28.9, adult: Secondary | ICD-10-CM | POA: Diagnosis not present

## 2019-10-17 DIAGNOSIS — F4310 Post-traumatic stress disorder, unspecified: Secondary | ICD-10-CM | POA: Diagnosis not present

## 2019-12-03 ENCOUNTER — Telehealth: Payer: Self-pay | Admitting: *Deleted

## 2019-12-03 NOTE — Telephone Encounter (Signed)
Patient states she is having an issue with her period and would like to discuss.  She has taken a pregnancy test and it was negative. Please call.

## 2019-12-03 NOTE — Telephone Encounter (Signed)
Pt stopped Depo one month ago. Afterwards, she bleed for 1 month. Going into month #2, she hasn't started period. No period x 4 weeks. Pt only got Depo one time. Pt concerned that her period is not coming like it should. I advised pt since she had bleeding for 1 month, that may be why she hasn't started this month. Pt was advised to give it more time and to call back if period don't come on it's own. Pt voiced understanding. [name]

## 2019-12-26 ENCOUNTER — Ambulatory Visit (HOSPITAL_COMMUNITY): Payer: Medicaid Other | Admitting: Psychiatry

## 2019-12-27 ENCOUNTER — Telehealth: Payer: Self-pay | Admitting: Adult Health

## 2019-12-27 NOTE — Telephone Encounter (Signed)

## 2019-12-31 ENCOUNTER — Other Ambulatory Visit: Payer: Self-pay

## 2019-12-31 ENCOUNTER — Encounter: Payer: Self-pay | Admitting: Adult Health

## 2019-12-31 ENCOUNTER — Ambulatory Visit: Payer: BC Managed Care – PPO | Admitting: Adult Health

## 2019-12-31 VITALS — BP 109/73 | HR 76 | Ht 62.0 in | Wt 165.0 lb

## 2019-12-31 DIAGNOSIS — N926 Irregular menstruation, unspecified: Secondary | ICD-10-CM | POA: Diagnosis not present

## 2019-12-31 DIAGNOSIS — Z3202 Encounter for pregnancy test, result negative: Secondary | ICD-10-CM | POA: Insufficient documentation

## 2019-12-31 LAB — POCT URINE PREGNANCY: Preg Test, Ur: NEGATIVE

## 2019-12-31 NOTE — Progress Notes (Signed)
  Subjective:     Patient ID: Casey West, female   DOB: December 01, 1990, 29 y.o.   MRN: [id number]  HPI Casey West is a 29 year old white female, married, [id number] in complaining of irregular period.Husband is thinking vasectomy.  PCP is [facility].   Review of Systems Had baby in July 2020 Got depo August 2020 No period til 12/03/2019 Has increased blond hair on chin Has gained about 15 lbs since August  Reviewed past medical,surgical, social and family history. Reviewed medications and allergies.     Objective:   Physical Exam BP 109/73 (BP Location: Left Arm, Patient Position: Sitting, Cuff Size: Normal)   Pulse 76   Ht 5\' 2"  (1.575 m)   Wt 165 lb (74.8 kg)   LMP 12/03/2019 (Approximate)   Breastfeeding No   BMI 30.18 kg/m  UPT is negative. Skin warm and dry. . Lungs: clear to ausculation bilaterally. Cardiovascular: regular rate and rhythm.   Fall risk is low.  Assessment:     1. Pregnancy examination or test, negative result  2. Irregular periods Keep period log, will follow for now Can pluck hair, do not shave Explained can be 6-18 months after depo to get regular    Plan:     Return in about 5 weeks for pap and physical

## 2020-01-24 DIAGNOSIS — Z20828 Contact with and (suspected) exposure to other viral communicable diseases: Secondary | ICD-10-CM | POA: Diagnosis not present

## 2020-01-24 DIAGNOSIS — R197 Diarrhea, unspecified: Secondary | ICD-10-CM | POA: Diagnosis not present

## 2020-01-24 DIAGNOSIS — R1110 Vomiting, unspecified: Secondary | ICD-10-CM | POA: Diagnosis not present

## 2020-01-24 DIAGNOSIS — R509 Fever, unspecified: Secondary | ICD-10-CM | POA: Diagnosis not present

## 2020-01-30 ENCOUNTER — Telehealth: Payer: Self-pay | Admitting: Adult Health

## 2020-01-30 NOTE — Telephone Encounter (Signed)

## 2020-02-04 ENCOUNTER — Other Ambulatory Visit (HOSPITAL_COMMUNITY)
Admission: RE | Admit: 2020-02-04 | Discharge: 2020-02-04 | Disposition: A | Discharge status: Home / Self Care | Payer: BC Managed Care – PPO | Source: Ambulatory Visit | Attending: Adult Health | Admitting: Adult Health

## 2020-02-04 ENCOUNTER — Ambulatory Visit (INDEPENDENT_AMBULATORY_CARE_PROVIDER_SITE_OTHER): Payer: BC Managed Care – PPO | Admitting: Adult Health

## 2020-02-04 ENCOUNTER — Other Ambulatory Visit: Payer: Self-pay | Admitting: Obstetrics & Gynecology

## 2020-02-04 ENCOUNTER — Other Ambulatory Visit: Payer: Self-pay

## 2020-02-04 ENCOUNTER — Encounter: Payer: Self-pay | Admitting: Adult Health

## 2020-02-04 VITALS — BP 104/74 | HR 87 | Ht 60.5 in | Wt 162.5 lb

## 2020-02-04 DIAGNOSIS — Z01411 Encounter for gynecological examination (general) (routine) with abnormal findings: Secondary | ICD-10-CM

## 2020-02-04 DIAGNOSIS — R350 Frequency of micturition: Secondary | ICD-10-CM

## 2020-02-04 DIAGNOSIS — N390 Urinary tract infection, site not specified: Secondary | ICD-10-CM

## 2020-02-04 DIAGNOSIS — Z01419 Encounter for gynecological examination (general) (routine) without abnormal findings: Secondary | ICD-10-CM | POA: Insufficient documentation

## 2020-02-04 DIAGNOSIS — R300 Dysuria: Secondary | ICD-10-CM | POA: Diagnosis not present

## 2020-02-04 LAB — POCT URINALYSIS DIPSTICK
Blood, UA: NEGATIVE
Glucose, UA: NEGATIVE
Ketones, UA: NEGATIVE
Nitrite, UA: POSITIVE
Protein, UA: NEGATIVE

## 2020-02-04 MED ORDER — URIBEL 118 MG PO CAPS: ORAL_CAPSULE | ORAL | 1 refills | Status: DC

## 2020-02-04 MED ORDER — SULFAMETHOXAZOLE-TRIMETHOPRIM 800-160 MG PO TABS: 1.0000 | ORAL_TABLET | Freq: Two times a day (BID) | ORAL | 0 refills | Status: DC

## 2020-02-04 NOTE — Progress Notes (Signed)
Patient ID: Casey West, female   DOB: [birth year], 29 y.o.   MRN: [id number] History of Present Illness: Casey West is a 29 year old white female,married, [id number] in for well woman gyn exam and pap.Pt complaining of urinary frequency and burning with urination and vaginal itching. She is using condoms and she had period in February and March. PCP is [facility].   Current Medications, Allergies, Past Medical History, Past Surgical History, Family History and Social History were reviewed in [name] record.     Review of Systems: Patient denies any headaches, hearing loss, fatigue, blurred vision, shortness of breath, chest pain, abdominal pain, problems with bowel movements,  or intercourse. No joint pain or mood swings. See HPI for positives.    Physical Exam:BP 104/74 (BP Location: Left Arm, Patient Position: Sitting, Cuff Size: Normal)   Pulse 87   Ht 5' 0.5" (1.537 m)   Wt 162 lb 8 oz (73.7 kg)   LMP 01/28/2020   Breastfeeding No   BMI 31.21 kg/m urine +nitrates and tace leuks General:  Well developed, well nourished, no acute distress Skin:  Warm and dry Neck:  Midline trachea, normal thyroid, good ROM, no lymphadenopathy Lungs; Clear to auscultation bilaterally Breast:  No dominant palpable mass, retraction, or nipple discharge Cardiovascular: Regular rate and rhythm Abdomen:  Soft, non tender, no hepatosplenomegaly Pelvic:  External genitalia is normal in appearance, no lesions.  The vagina is normal in appearance. Urethra has no lesions or masses. The cervix is bulbous. Pap with high risk HPV 16/18 genotyping performed. Uterus is felt to be normal size, shape, and contour.  No adnexal masses or tenderness noted.Bladder is non tender, no masses felt. Extremities/musculoskeletal:  No swelling or varicosities noted, no clubbing or cyanosis Psych:  No mood changes, alert and cooperative,seems happy Fall risk is low Alcohol audit is 1  PHQ 9 score is  0 Examination chaperoned by Malachy Mood LPN  Impression and Plan: 1. Encounter for gynecological examination with Papanicolaou smear of cervix Pap sent Physical in 1 year Pap in 3 if normal   2. Burning with urination UA C&S sent   3. Urinary frequency UA C&S sent   4. Urinary tract infection without hematuria, site unspecified UA C&S sent Will rx septra ds and uribel Meds ordered this encounter  Medications  . sulfamethoxazole-trimethoprim (BACTRIM DS) 800-160 MG tablet    Sig: Take 1 tablet by mouth 2 (two) times daily. Take 1 bid    Dispense:  14 tablet    Refill:  0    Order Specific Question:   Supervising Provider    Answer:   Despina Hidden, LUTHER H [[id number]]  . Meth-Hyo-M Bl-Na Phos-Ph Sal (URIBEL) 118 MG CAPS    Sig: Take 1 tid for 3 days    Dispense:  10 capsule    Refill:  1    Order Specific Question:   Supervising Provider    Answer:   Duane Lope H [[id number]]  Push water

## 2020-02-05 LAB — URINALYSIS, ROUTINE W REFLEX MICROSCOPIC
Bilirubin, UA: NEGATIVE
Glucose, UA: NEGATIVE
Ketones, UA: NEGATIVE
Nitrite, UA: POSITIVE — AB
Protein,UA: NEGATIVE
RBC, UA: NEGATIVE
Specific Gravity, UA: 1.014 (ref 1.005–1.030)
Urobilinogen, Ur: 0.2 mg/dL (ref 0.2–1.0)
pH, UA: 5.5 (ref 5.0–7.5)

## 2020-02-05 LAB — MICROSCOPIC EXAMINATION
Casts: NONE SEEN /lpf
RBC, Urine: NONE SEEN /hpf (ref 0–2)

## 2020-02-07 LAB — CYTOLOGY - PAP
Comment: NEGATIVE
Diagnosis: NEGATIVE
High risk HPV: NEGATIVE

## 2020-02-08 LAB — URINE CULTURE

## 2020-03-19 DIAGNOSIS — G43009 Migraine without aura, not intractable, without status migrainosus: Secondary | ICD-10-CM | POA: Diagnosis not present

## 2020-05-20 ENCOUNTER — Telehealth (HOSPITAL_COMMUNITY): Payer: Self-pay | Admitting: Psychiatry

## 2020-05-20 NOTE — Telephone Encounter (Signed)
Contacted patient to advise referral not accepted by Dr. Vanetta Shawl.

## 2020-05-30 ENCOUNTER — Telehealth (HOSPITAL_COMMUNITY): Payer: Self-pay | Admitting: *Deleted

## 2020-05-30 NOTE — Telephone Encounter (Signed)
New Patient referral received and called [phone]. Patient daughter picked up the phone and called for patient phone hung up. Called back and asked for patient and patient was talking to daughter and phone hung up. Called referring office and informed her with information and informed her that patient has 30 days to call back and referral will have to be resent.

## 2020-06-02 ENCOUNTER — Encounter (HOSPITAL_COMMUNITY): Payer: Self-pay | Admitting: Psychiatry

## 2020-06-02 ENCOUNTER — Other Ambulatory Visit: Payer: Self-pay

## 2020-06-02 ENCOUNTER — Telehealth (INDEPENDENT_AMBULATORY_CARE_PROVIDER_SITE_OTHER): Payer: BC Managed Care – PPO | Admitting: Psychiatry

## 2020-06-02 VITALS — Wt 174.0 lb

## 2020-06-02 DIAGNOSIS — F319 Bipolar disorder, unspecified: Secondary | ICD-10-CM

## 2020-06-02 DIAGNOSIS — F4310 Post-traumatic stress disorder, unspecified: Secondary | ICD-10-CM
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
## 2020-06-06 ENCOUNTER — Encounter: Payer: Self-pay | Admitting: *Deleted

## 2020-06-06 ENCOUNTER — Ambulatory Visit (INDEPENDENT_AMBULATORY_CARE_PROVIDER_SITE_OTHER): Payer: BC Managed Care – PPO | Admitting: *Deleted

## 2020-06-06 VITALS — BP 102/65 | HR 87 | Ht 62.0 in | Wt 162.0 lb

## 2020-06-06 DIAGNOSIS — Z3201 Encounter for pregnancy test, result positive: Secondary | ICD-10-CM

## 2020-06-06 LAB — POCT URINE PREGNANCY: Preg Test, Ur: POSITIVE — AB

## 2020-06-06 MED ORDER — DOXYLAMINE-PYRIDOXINE 10-10 MG PO TBEC: DELAYED_RELEASE_TABLET | ORAL | 1 refills | Status: DC

## 2020-06-06 MED ORDER — COMPLETENATE 29-1 MG PO CHEW: 1.0000 | CHEWABLE_TABLET | Freq: Every day | ORAL | 12 refills | Status: DC

## 2020-06-06 NOTE — Progress Notes (Signed)
Chart reviewed for nurse visit. Agree with plan of care. Will rx natachew and diclegis  Adline Potter, NP 06/06/2020 10:51 AM

## 2020-06-06 NOTE — Addendum Note (Signed)
Addended by: Cyril Mourning A on: 06/06/2020 10:54 AM   Modules accepted: Orders

## 2020-06-06 NOTE — Progress Notes (Signed)
   NURSE VISIT- PREGNANCY CONFIRMATION   SUBJECTIVE:  Casey West is a 29 y.o. [phone] female at Unknown by uncertain LMP of Patient's last menstrual period was 01/28/2020. Here for pregnancy confirmation.  Home pregnancy test: positive x one.   She reports nausea and vomiting.  She is not taking prenatal vitamins.    OBJECTIVE:  BP 102/65 (BP Location: Left Arm, Patient Position: Sitting, Cuff Size: Normal)   Pulse 87   Ht 5\' 2"  (1.575 m)   Wt 162 lb (73.5 kg)   LMP 01/28/2020   BMI 29.63 kg/m   Appears well, in no apparent distress OB History  Gravida Para Term Preterm AB Living  5 1 1  0 3 1  SAB TAB Ectopic Multiple Live Births  0 3 0   1    # Outcome Date GA Lbr Len/2nd Weight Sex Delivery Anes PTL Lv  5 Current           4 Term 05/02/19 [redacted]w[redacted]d 12:59 / 00:18 5 lb 5.5 oz (2.425 kg) F Vag-Spont EPI  LIV  3 TAB           2 TAB           1 TAB             No results found for this or any previous visit (from the past 24 hour(s)).  ASSESSMENT: Positive pregnancy test, Unknown by LMP    PLAN: Schedule for dating ultrasound in 2 weeks. Prenatal vitamins: note routed to 07/03/19, NP to send prescription   Nausea medicines: requested-note routed to [redacted]w[redacted]d, NP to send prescription   OB packet given: Yes  Adline Potter  06/06/2020 10:41 AM

## 2020-06-10 ENCOUNTER — Telehealth: Payer: Self-pay | Admitting: *Deleted

## 2020-06-10 MED ORDER — PROMETHAZINE HCL 25 MG PO TABS: 25.0000 mg | ORAL_TABLET | Freq: Four times a day (QID) | ORAL | 1 refills | Status: DC | PRN

## 2020-06-10 NOTE — Telephone Encounter (Signed)
Can you send Phenergan to pharmacy? Thanks!! [name]

## 2020-06-10 NOTE — Telephone Encounter (Signed)
Will rx phenergan  

## 2020-06-19 ENCOUNTER — Other Ambulatory Visit: Payer: BC Managed Care – PPO

## 2020-06-19 ENCOUNTER — Other Ambulatory Visit: Payer: Self-pay | Admitting: Obstetrics and Gynecology

## 2020-06-19 DIAGNOSIS — O3680X0 Pregnancy with inconclusive fetal viability, not applicable or unspecified: Secondary | ICD-10-CM

## 2020-07-02 ENCOUNTER — Other Ambulatory Visit: Payer: BC Managed Care – PPO

## 2020-07-02 ENCOUNTER — Encounter (HOSPITAL_COMMUNITY): Payer: Self-pay | Admitting: Psychiatry

## 2020-07-02 ENCOUNTER — Telehealth (INDEPENDENT_AMBULATORY_CARE_PROVIDER_SITE_OTHER): Payer: BC Managed Care – PPO | Admitting: Psychiatry

## 2020-07-02 ENCOUNTER — Ambulatory Visit (INDEPENDENT_AMBULATORY_CARE_PROVIDER_SITE_OTHER): Payer: BC Managed Care – PPO

## 2020-07-02 ENCOUNTER — Other Ambulatory Visit: Payer: Self-pay

## 2020-07-02 ENCOUNTER — Other Ambulatory Visit: Payer: Self-pay | Admitting: Obstetrics and Gynecology

## 2020-07-02 DIAGNOSIS — F4310 Post-traumatic stress disorder, unspecified: Secondary | ICD-10-CM

## 2020-07-02 DIAGNOSIS — Z1379 Encounter for other screening for genetic and chromosomal anomalies: Secondary | ICD-10-CM

## 2020-07-02 DIAGNOSIS — F319 Bipolar disorder, unspecified: Secondary | ICD-10-CM | POA: Diagnosis not present

## 2020-07-02 DIAGNOSIS — Z3481 Encounter for supervision of other normal pregnancy, first trimester: Secondary | ICD-10-CM | POA: Diagnosis not present

## 2020-07-02 DIAGNOSIS — O3680X0 Pregnancy with inconclusive fetal viability, not applicable or unspecified: Secondary | ICD-10-CM

## 2020-07-02 DIAGNOSIS — Z8759 Personal history of other complications of pregnancy, childbirth and the puerperium: Secondary | ICD-10-CM

## 2020-07-02 DIAGNOSIS — Z3A12 12 weeks gestation of pregnancy: Secondary | ICD-10-CM | POA: Diagnosis not present

## 2020-07-02 DIAGNOSIS — Z3682 Encounter for antenatal screening for nuchal translucency: Secondary | ICD-10-CM

## 2020-07-02 NOTE — Progress Notes (Signed)
[place] 12+2 wks,single IUP,CRL 58.39 mm,NT 1.4 mm,NB present,fhr 150 bpm,normal ovaries
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
## 2020-07-04 LAB — CBC/D/PLT+RPR+RH+ABO+RUB AB...
Antibody Screen: NEGATIVE
Basophils Absolute: 0 10*3/uL (ref 0.0–0.2)
Basos: 0 %
EOS (ABSOLUTE): 0.1 10*3/uL (ref 0.0–0.4)
Eos: 1 %
HCV Ab: <0.1 s/co ratio (ref 0.0–0.9)
HIV Screen 4th Generation wRfx: NONREACTIVE
Hematocrit: 39.8 % (ref 34.0–46.6)
Hemoglobin: 13.9 g/dL (ref 11.1–15.9)
Hepatitis B Surface Ag: NEGATIVE
Immature Grans (Abs): 0 10*3/uL (ref 0.0–0.1)
Immature Granulocytes: 0 %
Lymphocytes Absolute: 2.1 10*3/uL (ref 0.7–3.1)
Lymphs: 20 %
MCH: 30.8 pg (ref 26.6–33.0)
MCHC: 34.9 g/dL (ref 31.5–35.7)
MCV: 88 fL (ref 79–97)
Monocytes Absolute: 0.6 10*3/uL (ref 0.1–0.9)
Monocytes: 6 %
Neutrophils Absolute: 7.6 10*3/uL — ABNORMAL HIGH (ref 1.4–7.0)
Neutrophils: 73 %
Platelets: 255 10*3/uL (ref 150–450)
RBC: 4.52 x10E6/uL (ref 3.77–5.28)
RDW: 12.6 % (ref 11.7–15.4)
RPR Ser Ql: NONREACTIVE
Rh Factor: POSITIVE
Rubella Antibodies, IGG: <0.9 index — ABNORMAL LOW (ref >0.99)
WBC: 10.5 10*3/uL (ref 3.4–10.8)

## 2020-07-04 LAB — INTEGRATED 1
Crown Rump Length: 58.4 mm
Gest. Age on Collection Date: 12.1 weeks
Maternal Age at EDD: 32 yr
Nuchal Translucency (NT): 1.4 mm
Number of Fetuses: 1
PAPP-A Value: 518.9 ng/mL
Weight: 159 [lb_av]

## 2020-07-04 LAB — HCV INTERPRETATION

## 2020-07-17 ENCOUNTER — Encounter: Payer: BC Managed Care – PPO | Admitting: Advanced Practice Midwife

## 2020-07-17 ENCOUNTER — Ambulatory Visit: Payer: BC Managed Care – PPO | Admitting: *Deleted

## 2020-07-17 ENCOUNTER — Encounter: Payer: Self-pay | Admitting: Advanced Practice Midwife

## 2020-07-17 DIAGNOSIS — Z98891 History of uterine scar from previous surgery: Secondary | ICD-10-CM | POA: Insufficient documentation

## 2020-07-17 DIAGNOSIS — O1490 Unspecified pre-eclampsia, unspecified trimester: Secondary | ICD-10-CM | POA: Insufficient documentation

## 2020-07-23 ENCOUNTER — Other Ambulatory Visit: Payer: Self-pay | Admitting: Obstetrics and Gynecology

## 2020-07-23 MED ORDER — PROMETHAZINE HCL 25 MG RE SUPP: 25.0000 mg | Freq: Four times a day (QID) | RECTAL | 0 refills | Status: DC | PRN

## 2020-07-24 ENCOUNTER — Telehealth: Payer: Self-pay | Admitting: Obstetrics and Gynecology

## 2020-07-24 NOTE — Telephone Encounter (Signed)
Called patient to schedule an appointment to see Dr. Emelda Fear tomorrow, Patient stated that she is doing much better and she wanted the provider to know. I have scheduled an appointment for her to come on 07/25/2020 @ 11:30 am.

## 2020-07-25 ENCOUNTER — Ambulatory Visit: Payer: BC Managed Care – PPO | Admitting: Obstetrics and Gynecology

## 2020-07-25 ENCOUNTER — Telehealth: Payer: Self-pay | Admitting: Obstetrics and Gynecology

## 2020-07-25 NOTE — Telephone Encounter (Signed)
Patient called to notify office that she is no longer having vomiting or fever at this time and does not wish to come to apt that was scheduled for today at 1130 AM. Reminded patient that she has her new OB appointment scheduled on Monday at 2 PM. Patient states she does not know if she will make apt and will notify us by Monday morning if she needs to r/s.

## 2020-07-28 ENCOUNTER — Ambulatory Visit: Payer: BC Managed Care – PPO | Admitting: *Deleted

## 2020-07-28 ENCOUNTER — Other Ambulatory Visit: Payer: Self-pay

## 2020-07-28 ENCOUNTER — Ambulatory Visit (INDEPENDENT_AMBULATORY_CARE_PROVIDER_SITE_OTHER): Payer: BC Managed Care – PPO | Admitting: Advanced Practice Midwife

## 2020-07-28 ENCOUNTER — Encounter: Payer: Self-pay | Admitting: Advanced Practice Midwife

## 2020-07-28 VITALS — BP 105/68 | HR 90 | Wt 160.0 lb

## 2020-07-28 DIAGNOSIS — O09299 Supervision of pregnancy with other poor reproductive or obstetric history, unspecified trimester: Secondary | ICD-10-CM

## 2020-07-28 DIAGNOSIS — Z1389 Encounter for screening for other disorder: Secondary | ICD-10-CM

## 2020-07-28 DIAGNOSIS — Z20828 Contact with and (suspected) exposure to other viral communicable diseases: Secondary | ICD-10-CM | POA: Diagnosis not present

## 2020-07-28 DIAGNOSIS — F418 Other specified anxiety disorders: Secondary | ICD-10-CM

## 2020-07-28 DIAGNOSIS — Z3A16 16 weeks gestation of pregnancy: Secondary | ICD-10-CM

## 2020-07-28 DIAGNOSIS — Z3480 Encounter for supervision of other normal pregnancy, unspecified trimester: Secondary | ICD-10-CM

## 2020-07-28 DIAGNOSIS — Z363 Encounter for antenatal screening for malformations: Secondary | ICD-10-CM

## 2020-07-28 DIAGNOSIS — Z331 Pregnant state, incidental: Secondary | ICD-10-CM

## 2020-07-28 MED ORDER — ASPIRIN EC 81 MG PO TBEC: 162.0000 mg | DELAYED_RELEASE_TABLET | Freq: Every day | ORAL | 6 refills | Status: DC

## 2020-07-28 NOTE — Progress Notes (Signed)
INITIAL OBSTETRICAL VISIT Patient name: Casey West MRN [id number]  Date of birth: 03-27-1991 Chief Complaint:   Initial Prenatal Visit  History of Present Illness:   Casey West is a 29 y.o. [id number]  female at [redacted]w[redacted]d by LMP c/w u/s at 12 weeks with an Estimated Date of Delivery: 01/12/21 being seen today for her initial obstetrical visit.   Her obstetrical history is significant for pre-eclampsia.   Today she reports no complaints.  Depression screen [facility] 2/9 07/28/2020 02/04/2020  Decreased Interest 1 0  Down, Depressed, Hopeless 1 0  PHQ - 2 Score 2 0  Altered sleeping 1 0  Tired, decreased energy 3 0  Change in appetite 1 0  Feeling bad or failure about yourself  1 0  Trouble concentrating - 0  Moving slowly or fidgety/restless 0 0  Suicidal thoughts 0 0  PHQ-9 Score 8 0    No LMP recorded (lmp unknown). Patient is pregnant. Last pap 02/04/20. Results were: normal Review of Systems:   Pertinent items are noted in HPI Denies cramping/contractions, leakage of fluid, vaginal bleeding, abnormal vaginal discharge w/ itching/odor/irritation, headaches, visual changes, shortness of breath, chest pain, abdominal pain, severe nausea/vomiting, or problems with urination or bowel movements unless otherwise stated above.  Pertinent History Reviewed:  Reviewed past medical,surgical, social, obstetrical and family history.  Reviewed problem list, medications and allergies. OB History  Gravida Para Term Preterm AB Living  5 1 1  0 3 1  SAB TAB Ectopic Multiple Live Births  0 3 0   1    # Outcome Date GA Lbr Len/2nd Weight Sex Delivery Anes PTL Lv  5 Current           4 Term 05/02/19 [redacted]w[redacted]d 12:59 / 00:18 5 lb 5.5 oz (2.425 kg) F Vag-Spont EPI N LIV     Complications: Preeclampsia  3 TAB           2 TAB           1 TAB            Physical Assessment:   Vitals:   07/28/20 1529  BP: 105/68  Pulse: 90  Weight: 160 lb (72.6 kg)  Body mass index is 29.26 kg/m.       Physical  Examination:  General appearance - well appearing, and in no distress  Mental status - alert, oriented to person, place, and time  Psych:  She has a normal mood and affect  Skin - warm and dry, normal color, no suspicious lesions noted  Chest - effort normal  Heart - normal rate and regular rhythm  Abdomen - soft, nontender  Extremities:  No swelling or varicosities noted   No results found for this or any previous visit (from the past 24 hour(s)).  Assessment & Plan:  1) Low-Risk Pregnancy [id number] at [redacted]w[redacted]d with an Estimated Date of Delivery: 01/12/21   2) Initial OB visit  3) Depression/anxiety/PTSD:  Note written to psych to continue celexa and if benefits outweigh the risks, start Abilify . Continue w/weekly therapy sessions  4)  Hx PreE  Asa 162 mg  Meds:  Meds ordered this encounter  Medications  . aspirin EC 81 MG tablet    Sig: Take 2 tablets (162 mg total) by mouth daily.    Dispense:  60 tablet    Refill:  6    Order Specific Question:   Supervising Provider    Answer:   01/14/21 H [2510]  Initial labs obtained Continue prenatal vitamins Reviewed n/v relief measures and warning s/s to report Reviewed recommended weight gain based on pre-gravid BMI Encouraged well-balanced diet Genetic & carrier screening discussed: requests NT/IT, declines Panorama, AFP and Horizon 14  Ultrasound discussed; fetal survey: requested CCNC completed> form faxed if has or is planning to apply for medicaid The nature of [facility] [employer] with multiple MDs and other Advanced Practice Providers was explained to patient; also emphasized that fellows, residents, and students are part of our team. Jacklyn Shell 3:41 PM

## 2020-07-28 NOTE — Patient Instructions (Signed)
Casey West, I greatly value your feedback.  If you receive a survey following your visit with [place] today, we appreciate you taking the time to fill it out.  Thanks, Cathie Beams, DNP, CNM  [facility] HAS MOVED!!! It is now [facility] ([address] [place], [place] [address]) Entrance located off of [address] Free 24/7 valet parking   Nausea & Vomiting  Have saltine crackers or pretzels by your bed and eat a few bites before you raise your head out of bed in the morning  Eat small frequent meals throughout the day instead of large meals  Drink plenty of fluids throughout the day to stay hydrated, just don't drink a lot of fluids with your meals.  This can make your stomach fill up faster making you feel sick  Do not brush your teeth right after you eat  Products with real ginger are good for nausea, like ginger ale and ginger hard candy Make sure it says made with real ginger!  Sucking on sour candy like lemon heads is also good for nausea  If your prenatal vitamins make you nauseated, take them at night so you will sleep through the nausea  Sea Bands  If you feel like you need medicine for the nausea & vomiting please let us know  If you are unable to keep any fluids or food down please let us know   Constipation  Drink plenty of fluid, preferably water, throughout the day  Eat foods high in fiber such as fruits, vegetables, and grains  Exercise, such as walking, is a good way to keep your bowels regular  Drink warm fluids, especially warm prune juice, or decaf coffee  Eat a 1/2 cup of real oatmeal (not instant), 1/2 cup applesauce, and 1/2-1 cup warm prune juice every day  If needed, you may take Colace (docusate sodium) stool softener once or twice a day to help keep the stool soft.   If you still are having problems with constipation, you may take Miralax once daily as needed to help keep your bowels regular.   Home  Blood Pressure Monitoring for Patients   Your provider has recommended that you check your blood pressure (BP) at least once a week at home. If you do not have a blood pressure cuff at home, one will be provided for you. Contact your provider if you have not received your monitor within 1 week.   Helpful Tips for Accurate Home Blood Pressure Checks  . Don't smoke, exercise, or drink caffeine 30 minutes before checking your BP . Use the restroom before checking your BP (a full bladder can raise your pressure) . Relax in a comfortable upright chair . Feet on the ground . Left arm resting comfortably on a flat surface at the level of your heart . Legs uncrossed . Back supported . Sit quietly and don't talk . Place the cuff on your bare arm . Adjust snuggly, so that only two fingertips can fit between your skin and the top of the cuff . Check 2 readings separated by at least one minute . Keep a log of your BP readings . For a visual, please reference this diagram: http://ccnc.care/bpdiagram  Provider Name: [facility]     Phone: [phone]  Zone 1: ALL CLEAR  Continue to monitor your symptoms:  . BP reading is less than 140 (top number) or less than 90 (bottom number)  . No right upper stomach  pain . No headaches or seeing spots . No feeling nauseated or throwing up . No swelling in face and hands  Zone 2: CAUTION Call your doctor's office for any of the following:  . BP reading is greater than 140 (top number) or greater than 90 (bottom number)  . Stomach pain under your ribs in the middle or right side . Headaches or seeing spots . Feeling nauseated or throwing up . Swelling in face and hands  Zone 3: EMERGENCY  Seek immediate medical care if you have any of the following:  . BP reading is greater than160 (top number) or greater than 110 (bottom number) . Severe headaches not improving with Tylenol . Serious difficulty catching your breath . Any worsening symptoms  from Zone 2    First Trimester of Pregnancy The first trimester of pregnancy is from week 1 until the end of week 12 (months 1 through 3). A week after a sperm fertilizes an egg, the egg will implant on the wall of the uterus. This embryo will begin to develop into a baby. Genes from you and your partner are forming the baby. The female genes determine whether the baby is a boy or a girl. At 6-8 weeks, the eyes and face are formed, and the heartbeat can be seen on ultrasound. At the end of 12 weeks, all the baby's organs are formed.  Now that you are pregnant, you will want to do everything you can to have a healthy baby. Two of the most important things are to get good prenatal care and to follow your health care provider's instructions. Prenatal care is all the medical care you receive before the baby's birth. This care will help prevent, find, and treat any problems during the pregnancy and childbirth. BODY CHANGES Your body goes through many changes during pregnancy. The changes vary from woman to woman.   You may gain or lose a couple of pounds at first.  You may feel sick to your stomach (nauseous) and throw up (vomit). If the vomiting is uncontrollable, call your health care provider.  You may tire easily.  You may develop headaches that can be relieved by medicines approved by your health care provider.  You may urinate more often. Painful urination may mean you have a bladder infection.  You may develop heartburn as a result of your pregnancy.  You may develop constipation because certain hormones are causing the muscles that push waste through your intestines to slow down.  You may develop hemorrhoids or swollen, bulging veins (varicose veins).  Your breasts may begin to grow larger and become tender. Your nipples may stick out more, and the tissue that surrounds them (areola) may become darker.  Your gums may bleed and may be sensitive to brushing and flossing.  Dark spots or  blotches (chloasma, mask of pregnancy) may develop on your face. This will likely fade after the baby is born.  Your menstrual periods will stop.  You may have a loss of appetite.  You may develop cravings for certain kinds of food.  You may have changes in your emotions from day to day, such as being excited to be pregnant or being concerned that something may go wrong with the pregnancy and baby.  You may have more vivid and strange dreams.  You may have changes in your hair. These can include thickening of your hair, rapid growth, and changes in texture. Some women also have hair loss during or after pregnancy, or hair that  feels dry or thin. Your hair will most likely return to normal after your baby is born. WHAT TO EXPECT AT YOUR PRENATAL VISITS During a routine prenatal visit:  You will be weighed to make sure you and the baby are growing normally.  Your blood pressure will be taken.  Your abdomen will be measured to track your baby's growth.  The fetal heartbeat will be listened to starting around week 10 or 12 of your pregnancy.  Test results from any previous visits will be discussed. Your health care provider may ask you:  How you are feeling.  If you are feeling the baby move.  If you have had any abnormal symptoms, such as leaking fluid, bleeding, severe headaches, or abdominal cramping.  If you have any questions. Other tests that may be performed during your first trimester include:  Blood tests to find your blood type and to check for the presence of any previous infections. They will also be used to check for low iron levels (anemia) and Rh antibodies. Later in the pregnancy, blood tests for diabetes will be done along with other tests if problems develop.  Urine tests to check for infections, diabetes, or protein in the urine.  An ultrasound to confirm the proper growth and development of the baby.  An amniocentesis to check for possible genetic  problems.  Fetal screens for spina bifida and Down syndrome.  You may need other tests to make sure you and the baby are doing well. HOME CARE INSTRUCTIONS  Medicines  Follow your health care provider's instructions regarding medicine use. Specific medicines may be either safe or unsafe to take during pregnancy.  Take your prenatal vitamins as directed.  If you develop constipation, try taking a stool softener if your health care provider approves. Diet  Eat regular, well-balanced meals. Choose a variety of foods, such as meat or vegetable-based protein, fish, milk and low-fat dairy products, vegetables, fruits, and whole grain breads and cereals. Your health care provider will help you determine the amount of weight gain that is right for you.  Avoid raw meat and uncooked cheese. These carry germs that can cause birth defects in the baby.  Eating four or five small meals rather than three large meals a day may help relieve nausea and vomiting. If you start to feel nauseous, eating a few soda crackers can be helpful. Drinking liquids between meals instead of during meals also seems to help nausea and vomiting.  If you develop constipation, eat more high-fiber foods, such as fresh vegetables or fruit and whole grains. Drink enough fluids to keep your urine clear or pale yellow. Activity and Exercise  Exercise only as directed by your health care provider. Exercising will help you:  Control your weight.  Stay in shape.  Be prepared for labor and delivery.  Experiencing pain or cramping in the lower abdomen or low back is a good sign that you should stop exercising. Check with your health care provider before continuing normal exercises.  Try to avoid standing for long periods of time. Move your legs often if you must stand in one place for a long time.  Avoid heavy lifting.  Wear low-heeled shoes, and practice good posture.  You may continue to have sex unless your health care  provider directs you otherwise. Relief of Pain or Discomfort  Wear a good support bra for breast tenderness.    Take warm sitz baths to soothe any pain or discomfort caused by hemorrhoids. Use hemorrhoid cream  if your health care provider approves.    Rest with your legs elevated if you have leg cramps or low back pain.  If you develop varicose veins in your legs, wear support hose. Elevate your feet for 15 minutes, 3-4 times a day. Limit salt in your diet. Prenatal Care  Schedule your prenatal visits by the twelfth week of pregnancy. They are usually scheduled monthly at first, then more often in the last 2 months before delivery.  Write down your questions. Take them to your prenatal visits.  Keep all your prenatal visits as directed by your health care provider. Safety  Wear your seat belt at all times when driving.  Make a list of emergency phone numbers, including numbers for family, friends, the hospital, and police and fire departments. General Tips  Ask your health care provider for a referral to a local prenatal education class. Begin classes no later than at the beginning of month 6 of your pregnancy.  Ask for help if you have counseling or nutritional needs during pregnancy. Your health care provider can offer advice or refer you to specialists for help with various needs.  Do not use hot tubs, steam rooms, or saunas.  Do not douche or use tampons or scented sanitary pads.  Do not cross your legs for long periods of time.  Avoid cat litter boxes and soil used by cats. These carry germs that can cause birth defects in the baby and possibly loss of the fetus by miscarriage or stillbirth.  Avoid all smoking, herbs, alcohol, and medicines not prescribed by your health care provider. Chemicals in these affect the formation and growth of the baby.  Schedule a dentist appointment. At home, brush your teeth with a soft toothbrush and be gentle when you floss. SEEK MEDICAL  CARE IF:   You have dizziness.  You have mild pelvic cramps, pelvic pressure, or nagging pain in the abdominal area.  You have persistent nausea, vomiting, or diarrhea.  You have a bad smelling vaginal discharge.  You have pain with urination.  You notice increased swelling in your face, hands, legs, or ankles. SEEK IMMEDIATE MEDICAL CARE IF:   You have a fever.  You are leaking fluid from your vagina.  You have spotting or bleeding from your vagina.  You have severe abdominal cramping or pain.  You have rapid weight gain or loss.  You vomit blood or material that looks like coffee grounds.  You are exposed to [place] measles and have never had them.  You are exposed to fifth disease or chickenpox.  You develop a severe headache.  You have shortness of breath.  You have any kind of trauma, such as from a fall or a car accident. Document Released: 10/12/2001 Document Revised: 03/04/2014 Document Reviewed: 08/28/2013 [facility] Patient Information 2015 [place], [place]. This information is not intended to replace advice given to you by your health care provider. Make sure you discuss any questions you have with your health care provider.  Coronavirus (COVID-19) Are you at risk?  Are you at risk for the Coronavirus (COVID-19)?  To be considered HIGH RISK for Coronavirus (COVID-19), you have to meet the following criteria:  . Traveled to [place], [place], [place], [place] or [place]; or in the [place] to [place], [place], [place], or [place]; and have fever, cough, and shortness of breath within the last 2 weeks of travel OR . Been in close contact with a person diagnosed with COVID-19 within the last 2  weeks and have fever, cough, and shortness of breath . IF YOU DO NOT MEET THESE CRITERIA, YOU ARE CONSIDERED LOW RISK FOR COVID-19.  What to do if you are HIGH RISK for COVID-19?  Marland Kitchen If you are having a medical emergency, call 911. . Seek medical care right away.  Before you go to a doctor's office, urgent care or emergency department, call ahead and tell them about your recent travel, contact with someone diagnosed with COVID-19, and your symptoms. You should receive instructions from your physician's office regarding next steps of care.  . When you arrive at healthcare provider, tell the healthcare staff immediately you have returned from visiting [place], [place], [place], [place] or [place]; or traveled in the [place] to [place], [place], [place], or [place]; in the last two weeks or you have been in close contact with a person diagnosed with COVID-19 in the last 2 weeks.   . Tell the health care staff about your symptoms: fever, cough and shortness of breath. . After you have been seen by a medical provider, you will be either: o Tested for (COVID-19) and discharged home on quarantine except to seek medical care if symptoms worsen, and asked to  - Stay home and avoid contact with others until you get your results (4-5 days)  - Avoid travel on public transportation if possible (such as bus, train, or airplane) or o Sent to the Emergency Department by EMS for evaluation, COVID-19 testing, and possible admission depending on your condition and test results.  What to do if you are LOW RISK for COVID-19?  Reduce your risk of any infection by using the same precautions used for avoiding the common cold or flu:  Marland Kitchen Wash your hands often with soap and warm water for at least 20 seconds.  If soap and water are not readily available, use an alcohol-based hand sanitizer with at least 60% alcohol.  . If coughing or sneezing, cover your mouth and nose by coughing or sneezing into the elbow areas of your shirt or coat, into a tissue or into your sleeve (not your hands). . Avoid shaking hands with others and consider head nods or verbal greetings only. . Avoid touching your eyes, nose, or mouth with unwashed hands.  . Avoid close contact with people who are  sick. . Avoid places or events with large numbers of people in one location, like concerts or sporting events. . Carefully consider travel plans you have or are making. . If you are planning any travel outside or inside the [place], visit the CDC's Travelers' Health webpage for the latest health notices. . If you have some symptoms but not all symptoms, continue to monitor at home and seek medical attention if your symptoms worsen. . If you are having a medical emergency, call 911.   [place] / e-Visit: [email or web address]         [facility]: [facility]: [phone]                   [facility]: [phone]     Safe Medications in Pregnancy   Acne: Benzoyl Peroxide Salicylic Acid  Backache/Headache: Tylenol: 2 regular strength every 4 hours OR              2 Extra strength every 6 hours  Colds/Coughs/Allergies: Benadryl (alcohol free) 25 mg every 6 hours as needed Breath right strips Claritin  Cepacol throat lozenges Chloraseptic throat spray Cold-Eeze- up to three times per day Cough drops, alcohol free Flonase (by prescription only) Guaifenesin Mucinex Robitussin DM (plain only, alcohol free) Saline nasal spray/drops Sudafed (pseudoephedrine) & Actifed ** use only after [redacted] weeks gestation and if you do not have high blood pressure Tylenol Vicks Vaporub Zinc lozenges Zyrtec   Constipation: Colace Ducolax suppositories Fleet enema Glycerin suppositories Metamucil Milk of magnesia Miralax Senokot Smooth move tea  Diarrhea: Kaopectate Imodium A-D  *NO pepto Bismol  Hemorrhoids: Anusol Anusol HC Preparation H Tucks  Indigestion: Tums Maalox Mylanta Zantac  Pepcid  Insomnia: Benadryl (alcohol free) 69m every 6 hours as needed Tylenol PM Unisom, no Gelcaps  Leg  Cramps: Tums MagGel  Nausea/Vomiting:  Bonine Dramamine Emetrol Ginger extract Sea bands Meclizine  Nausea medication to take during pregnancy:  Unisom (doxylamine succinate 25 mg tablets) Take one tablet daily at bedtime. If symptoms are not adequately controlled, the dose can be increased to a maximum recommended dose of two tablets daily (1/2 tablet in the morning, 1/2 tablet mid-afternoon and one at bedtime). Vitamin B6 1035mtablets. Take one tablet twice a day (up to 200 mg per day).  Skin Rashes: Aveeno products Benadryl cream or 2582mvery 6 hours as needed Calamine Lotion 1% cortisone cream  Yeast infection: Gyne-lotrimin 7 Monistat 7   **If taking multiple medications, please check labels to avoid duplicating the same active ingredients **take medication as directed on the label ** Do not exceed 4000 mg of tylenol in 24 hours **Do not take medications that contain aspirin or ibuprofen

## 2020-08-20 ENCOUNTER — Encounter: Payer: Self-pay | Admitting: Advanced Practice Midwife

## 2020-08-20 ENCOUNTER — Ambulatory Visit (INDEPENDENT_AMBULATORY_CARE_PROVIDER_SITE_OTHER): Payer: BC Managed Care – PPO | Admitting: Advanced Practice Midwife

## 2020-08-20 ENCOUNTER — Ambulatory Visit (INDEPENDENT_AMBULATORY_CARE_PROVIDER_SITE_OTHER): Payer: BC Managed Care – PPO

## 2020-08-20 ENCOUNTER — Other Ambulatory Visit: Payer: Self-pay

## 2020-08-20 VITALS — BP 110/66 | HR 79 | Wt 162.0 lb

## 2020-08-20 DIAGNOSIS — Z1389 Encounter for screening for other disorder: Secondary | ICD-10-CM

## 2020-08-20 DIAGNOSIS — Z363 Encounter for antenatal screening for malformations: Secondary | ICD-10-CM

## 2020-08-20 DIAGNOSIS — Z283 Underimmunization status: Secondary | ICD-10-CM

## 2020-08-20 DIAGNOSIS — O99891 Other specified diseases and conditions complicating pregnancy: Secondary | ICD-10-CM

## 2020-08-20 DIAGNOSIS — Z2839 Other underimmunization status: Secondary | ICD-10-CM

## 2020-08-20 DIAGNOSIS — O09899 Supervision of other high risk pregnancies, unspecified trimester: Secondary | ICD-10-CM

## 2020-08-20 DIAGNOSIS — Z331 Pregnant state, incidental: Secondary | ICD-10-CM

## 2020-08-20 DIAGNOSIS — Z3480 Encounter for supervision of other normal pregnancy, unspecified trimester: Secondary | ICD-10-CM

## 2020-08-20 DIAGNOSIS — Z3A19 19 weeks gestation of pregnancy: Secondary | ICD-10-CM

## 2020-08-20 DIAGNOSIS — O09299 Supervision of pregnancy with other poor reproductive or obstetric history, unspecified trimester: Secondary | ICD-10-CM

## 2020-08-20 NOTE — Progress Notes (Signed)
[place] 19+2 wks,breech,cx length 5 cm,anterior placenta gr 0,normal ovaries,svp of fluid 4.6 cm,efw 281 g 41%,anatomy complete,no obvious abnormalities

## 2020-08-20 NOTE — Patient Instructions (Addendum)
Casey West, I greatly value your feedback.  If you receive a survey following your visit with [place] today, we appreciate you taking the time to fill it out.  Thanks, Philipp Deputy , CNM  [facility] ([address] [place], [place] [address]) Entrance C, located off of [address] valet parking  Go to [email or web address] to register for FREE online childbirth classes  [place] Pediatricians/Family Doctors:  [facility] [phone]            [facility] [phone]                 [facility] [phone] (usually not accepting new patients unless you have family there already, you are always welcome to call and ask)       [facility] Department [phone]       [facility] Pediatricians/Family Doctors:   [facility]: [phone]  [facility]: [phone]  [facility]: [phone]  [facility] Doctors:   [facility]: [phone]   [facility]: [phone]  [facility] Doctors:  [facility]: [phone]    Home Blood Pressure Monitoring for Patients   Your provider has recommended that you check your blood pressure (BP) at least once a week at home. If you do not have a blood pressure cuff at home, one will be provided for you. Contact your provider if you have not received your monitor within 1 week.   Helpful Tips for Accurate Home Blood Pressure Checks  . Don't smoke, exercise, or drink caffeine 30 minutes before checking your BP . Use the restroom before checking your BP (a full bladder can raise your pressure) . Relax in a comfortable upright chair . Feet on the ground . Left arm resting comfortably on a flat surface at the level of your heart . Legs uncrossed . Back supported . Sit quietly and don't talk . Place the cuff on your bare arm . Adjust snuggly, so that only  two fingertips can fit between your skin and the top of the cuff . Check 2 readings separated by at least one minute . Keep a log of your BP readings . For a visual, please reference this diagram: http://ccnc.care/bpdiagram  Provider Name: [facility]     Phone: [phone]  Zone 1: ALL CLEAR  Continue to monitor your symptoms:  . BP reading is less than 140 (top number) or less than 90 (bottom number)  . No right upper stomach pain . No headaches or seeing spots . No feeling nauseated or throwing up . No swelling in face and hands  Zone 2: CAUTION Call your doctor's office for any of the following:  . BP reading is greater than 140 (top number) or greater than 90 (bottom number)  . Stomach pain under your ribs in the middle or right side . Headaches or seeing spots . Feeling nauseated or throwing up . Swelling in face and hands  Zone 3: EMERGENCY  Seek immediate medical care if you have any of the following:  . BP reading is greater than160 (top number) or greater than 110 (bottom number) . Severe headaches not improving with Tylenol . Serious difficulty catching your breath . Any worsening symptoms from Zone 2     Second Trimester of Pregnancy The second trimester is from week 14 through week 27 (months 4 through 6). The second trimester is often a time when you feel your  best. Your body has adjusted to being pregnant, and you begin to feel better physically. Usually, morning sickness has lessened or quit completely, you may have more energy, and you may have an increase in appetite. The second trimester is also a time when the fetus is growing rapidly. At the end of the sixth month, the fetus is about 9 inches long and weighs about 1 pounds. You will likely begin to feel the baby move (quickening) between 16 and 20 weeks of pregnancy. Body changes during your second trimester Your body continues to go through many changes during your second trimester. The changes vary  from woman to woman.  Your weight will continue to increase. You will notice your lower abdomen bulging out.  You may begin to get stretch marks on your hips, abdomen, and breasts.  You may develop headaches that can be relieved by medicines. The medicines should be approved by your health care provider.  You may urinate more often because the fetus is pressing on your bladder.  You may develop or continue to have heartburn as a result of your pregnancy.  You may develop constipation because certain hormones are causing the muscles that push waste through your intestines to slow down.  You may develop hemorrhoids or swollen, bulging veins (varicose veins).  You may have back pain. This is caused by: ? Weight gain. ? Pregnancy hormones that are relaxing the joints in your pelvis. ? A shift in weight and the muscles that support your balance.  Your breasts will continue to grow and they will continue to become tender.  Your gums may bleed and may be sensitive to brushing and flossing.  Dark spots or blotches (chloasma, mask of pregnancy) may develop on your face. This will likely fade after the baby is born.  A dark line from your belly button to the pubic area (linea nigra) may appear. This will likely fade after the baby is born.  You may have changes in your hair. These can include thickening of your hair, rapid growth, and changes in texture. Some women also have hair loss during or after pregnancy, or hair that feels dry or thin. Your hair will most likely return to normal after your baby is born.  What to expect at prenatal visits During a routine prenatal visit:  You will be weighed to make sure you and the fetus are growing normally.  Your blood pressure will be taken.  Your abdomen will be measured to track your baby's growth.  The fetal heartbeat will be listened to.  Any test results from the previous visit will be discussed.  Your health care provider may ask  you:  How you are feeling.  If you are feeling the baby move.  If you have had any abnormal symptoms, such as leaking fluid, bleeding, severe headaches, or abdominal cramping.  If you are using any tobacco products, including cigarettes, chewing tobacco, and electronic cigarettes.  If you have any questions.  Other tests that may be performed during your second trimester include:  Blood tests that check for: ? Low iron levels (anemia). ? High blood sugar that affects pregnant women (gestational diabetes) between 32 and 28 weeks. ? Rh antibodies. This is to check for a protein on red blood cells (Rh factor).  Urine tests to check for infections, diabetes, or protein in the urine.  An ultrasound to confirm the proper growth and development of the baby.  An amniocentesis to check for possible genetic problems.  Fetal  screens for spina bifida and Down syndrome.  HIV (human immunodeficiency virus) testing. Routine prenatal testing includes screening for HIV, unless you choose not to have this test.  Follow these instructions at home: Medicines  Follow your health care provider's instructions regarding medicine use. Specific medicines may be either safe or unsafe to take during pregnancy.  Take a prenatal vitamin that contains at least 600 micrograms (mcg) of folic acid.  If you develop constipation, try taking a stool softener if your health care provider approves. Eating and drinking  Eat a balanced diet that includes fresh fruits and vegetables, whole grains, good sources of protein such as meat, eggs, or tofu, and low-fat dairy. Your health care provider will help you determine the amount of weight gain that is right for you.  Avoid raw meat and uncooked cheese. These carry germs that can cause birth defects in the baby.  If you have low calcium intake from food, talk to your health care provider about whether you should take a daily calcium supplement.  Limit foods that  are high in fat and processed sugars, such as fried and sweet foods.  To prevent constipation: ? Drink enough fluid to keep your urine clear or pale yellow. ? Eat foods that are high in fiber, such as fresh fruits and vegetables, whole grains, and beans. Activity  Exercise only as directed by your health care provider. Most women can continue their usual exercise routine during pregnancy. Try to exercise for 30 minutes at least 5 days a week. Stop exercising if you experience uterine contractions.  Avoid heavy lifting, wear low heel shoes, and practice good posture.  A sexual relationship may be continued unless your health care provider directs you otherwise. Relieving pain and discomfort  Wear a good support bra to prevent discomfort from breast tenderness.  Take warm sitz baths to soothe any pain or discomfort caused by hemorrhoids. Use hemorrhoid cream if your health care provider approves.  Rest with your legs elevated if you have leg cramps or low back pain.  If you develop varicose veins, wear support hose. Elevate your feet for 15 minutes, 3-4 times a day. Limit salt in your diet. Prenatal Care  Write down your questions. Take them to your prenatal visits.  Keep all your prenatal visits as told by your health care provider. This is important. Safety  Wear your seat belt at all times when driving.  Make a list of emergency phone numbers, including numbers for family, friends, the hospital, and police and fire departments. General instructions  Ask your health care provider for a referral to a local prenatal education class. Begin classes no later than the beginning of month 6 of your pregnancy.  Ask for help if you have counseling or nutritional needs during pregnancy. Your health care provider can offer advice or refer you to specialists for help with various needs.  Do not use hot tubs, steam rooms, or saunas.  Do not douche or use tampons or scented sanitary  pads.  Do not cross your legs for long periods of time.  Avoid cat litter boxes and soil used by cats. These carry germs that can cause birth defects in the baby and possibly loss of the fetus by miscarriage or stillbirth.  Avoid all smoking, herbs, alcohol, and unprescribed drugs. Chemicals in these products can affect the formation and growth of the baby.  Do not use any products that contain nicotine or tobacco, such as cigarettes and e-cigarettes. If you need help  quitting, ask your health care provider.  Visit your dentist if you have not gone yet during your pregnancy. Use a soft toothbrush to brush your teeth and be gentle when you floss. Contact a health care provider if:  You have dizziness.  You have mild pelvic cramps, pelvic pressure, or nagging pain in the abdominal area.  You have persistent nausea, vomiting, or diarrhea.  You have a bad smelling vaginal discharge.  You have pain when you urinate. Get help right away if:  You have a fever.  You are leaking fluid from your vagina.  You have spotting or bleeding from your vagina.  You have severe abdominal cramping or pain.  You have rapid weight gain or weight loss.  You have shortness of breath with chest pain.  You notice sudden or extreme swelling of your face, hands, ankles, feet, or legs.  You have not felt your baby move in over an hour.  You have severe headaches that do not go away when you take medicine.  You have vision changes. Summary  The second trimester is from week 14 through week 27 (months 4 through 6). It is also a time when the fetus is growing rapidly.  Your body goes through many changes during pregnancy. The changes vary from woman to woman.  Avoid all smoking, herbs, alcohol, and unprescribed drugs. These chemicals affect the formation and growth your baby.  Do not use any tobacco products, such as cigarettes, chewing tobacco, and e-cigarettes. If you need help quitting, ask your  health care provider.  Contact your health care provider if you have any questions. Keep all prenatal visits as told by your health care provider. This is important. This information is not intended to replace advice given to you by your health care provider. Make sure you discuss any questions you have with your health care provider. Document Released: 10/12/2001 Document Revised: 03/25/2016 Document Reviewed: 12/19/2012 Elsevier Interactive Patient Education  2017 [employer]

## 2020-08-20 NOTE — Progress Notes (Signed)
   LOW-RISK PREGNANCY VISIT Patient name: Casey West MRN [id number]  Date of birth: 1991-05-29 Chief Complaint:   Routine Prenatal Visit (anatomy scan)  History of Present Illness:   Casey West is a 29 y.o. [id number] female at [redacted]w[redacted]d with an Estimated Date of Delivery: 01/12/21 being seen today for ongoing management of a low-risk pregnancy.  Today she reports doing well; considering vasectomy vs BTL. Contractions: Not present. Vag. Bleeding: None.   . denies leaking of fluid. Review of Systems:   Pertinent items are noted in HPI Denies abnormal vaginal discharge w/ itching/odor/irritation, headaches, visual changes, shortness of breath, chest pain, abdominal pain, severe nausea/vomiting, or problems with urination or bowel movements unless otherwise stated above. Pertinent History Reviewed:  Reviewed past medical,surgical, social, obstetrical and family history.  Reviewed problem list, medications and allergies. Physical Assessment:   Vitals:   08/20/20 1214  BP: 110/66  Pulse: 79  Weight: 162 lb (73.5 kg)  Body mass index is 29.63 kg/m.        Physical Examination:   General appearance: Well appearing, and in no distress  Mental status: Alert, oriented to person, place, and time  Skin: Warm & dry  Cardiovascular: Normal heart rate noted  Respiratory: Normal respiratory effort, no distress  Abdomen: Soft, gravid, nontender  Pelvic: Cervical exam deferred         Extremities:    Fetal Status: Fetal Heart Rate (bpm): 148 u/s         Anatomy u/s: Casey West 19+2 wks,breech,cx length 5 cm,anterior placenta gr 0,normal ovaries,svp of fluid 4.6 cm,efw 281 g 41%,anatomy complete,no obvious abnormalities   No results found for this or any previous visit (from the past 24 hour(s)).  Assessment & Plan:  1) Low-risk pregnancy [id number] at [redacted]w[redacted]d with an Estimated Date of Delivery: 01/12/21   2) Depression/anxiety/PTDS, on Celexa and Abilify and having therapy; stable  3) Would like to  breastfeed, reviewed may be okay with Celexa and Abilify, but on further research, Ability may decrease milk production   Meds: No orders of the defined types were placed in this encounter.  Labs/procedures today: 2nd IT (and baseline pre-e labs + other labs not collected at [facility])  Plan:  Continue routine obstetrical care   Reviewed: Preterm labor symptoms and general obstetric precautions including but not limited to vaginal bleeding, contractions, leaking of fluid and fetal movement were reviewed in detail with the patient.  All questions were answered. Has home bp cuff. Check bp weekly, let Casey West know if >140/90.   Follow-up: No follow-ups on file.  Orders Placed This Encounter  Procedures  . POC Urinalysis Dipstick OB   Arabella Merles [facility] 08/20/2020 12:48 PM

## 2020-08-21 LAB — INTEGRATED 2

## 2020-08-23 LAB — INTEGRATED 2
AFP MoM: 1.13
Alpha-Fetoprotein: 52.4 ng/mL
Crown Rump Length: 58.4 mm
DIA MoM: 0.67
Gest. Age on Collection Date: 12.1 weeks
Gestational Age: 19.1 weeks
Maternal Age at EDD: 32 yr
Nuchal Translucency (NT): 1.4 mm
Nuchal Translucency MoM: 1.08
PAPP-A MoM: 0.64
PAPP-A Value: 518.9 ng/mL
Test Results:: NEGATIVE
Weight: 159 [lb_av]
Weight: 159 [lb_av]
hCG MoM: 0.68
hCG Value: 15.5 IU/mL
uE3 MoM: 0.96

## 2020-08-23 LAB — COMPREHENSIVE METABOLIC PANEL
ALT: 16 IU/L (ref 0–32)
AST: 11 IU/L (ref 0–40)
Albumin/Globulin Ratio: 1.6 (ref 1.2–2.2)
Albumin: 4.1 g/dL (ref 3.8–4.8)
Alkaline Phosphatase: 65 IU/L (ref 44–121)
BUN/Creatinine Ratio: 21 (ref 9–23)
BUN: 10 mg/dL (ref 6–20)
Bilirubin Total: 0.3 mg/dL (ref 0.0–1.2)
CO2: 20 mmol/L (ref 20–29)
Calcium: 9.5 mg/dL (ref 8.7–10.2)
Chloride: 100 mmol/L (ref 96–106)
Creatinine, Ser: 0.48 mg/dL — ABNORMAL LOW (ref 0.57–1.00)
GFR calc Af Amer: 151 mL/min/{1.73_m2} (ref >59)
GFR calc non Af Amer: 131 mL/min/{1.73_m2} (ref >59)
Globulin, Total: 2.5 g/dL (ref 1.5–4.5)
Glucose: 80 mg/dL (ref 65–99)
Potassium: 4.3 mmol/L (ref 3.5–5.2)
Sodium: 135 mmol/L (ref 134–144)
Total Protein: 6.6 g/dL (ref 6.0–8.5)

## 2020-08-23 LAB — GC/CHLAMYDIA PROBE AMP
Chlamydia trachomatis, NAA: NEGATIVE
Neisseria Gonorrhoeae by PCR: NEGATIVE

## 2020-08-23 LAB — PROTEIN / CREATININE RATIO, URINE
Creatinine, Urine: 64.2 mg/dL
Protein, Ur: 4.1 mg/dL
Protein/Creat Ratio: 64 mg/g creat (ref 0–200)

## 2020-09-02 ENCOUNTER — Telehealth (INDEPENDENT_AMBULATORY_CARE_PROVIDER_SITE_OTHER): Payer: BC Managed Care – PPO | Admitting: Psychiatry

## 2020-09-02 ENCOUNTER — Other Ambulatory Visit: Payer: Self-pay

## 2020-09-02 ENCOUNTER — Encounter (HOSPITAL_COMMUNITY): Payer: Self-pay | Admitting: Psychiatry

## 2020-09-02 DIAGNOSIS — F3110 Bipolar disorder, current episode manic without psychotic features, unspecified: Secondary | ICD-10-CM

## 2020-09-02 DIAGNOSIS — F4310 Post-traumatic stress disorder, unspecified: Secondary | ICD-10-CM | POA: Diagnosis not present
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
## 2020-09-09 ENCOUNTER — Telehealth: Payer: Self-pay | Admitting: Women's Health

## 2020-09-09 NOTE — Telephone Encounter (Signed)
Left message @ 3:12 pm. [name]

## 2020-09-09 NOTE — Telephone Encounter (Signed)
Having lightheaded and dizziness x 3 days. Pt states she is drinking plenty of water so she knows she's not dehydrated. Pt is eating small frequent meals. BP 110/75 pulse 97. I advised dizziness can be common during pregnancy and continue to drink plenty of water.  Any further recommendations? [name]

## 2020-09-09 NOTE — Telephone Encounter (Signed)
Patient states she is having lightheadedness for two days now patient denies temp. Patient states bp is within normal limits. Would like nurse to follow up.

## 2020-09-10 ENCOUNTER — Other Ambulatory Visit (INDEPENDENT_AMBULATORY_CARE_PROVIDER_SITE_OTHER): Payer: BC Managed Care – PPO

## 2020-09-10 ENCOUNTER — Other Ambulatory Visit: Payer: Self-pay

## 2020-09-10 VITALS — BP 100/63 | HR 91

## 2020-09-10 DIAGNOSIS — R42 Dizziness and giddiness: Secondary | ICD-10-CM | POA: Diagnosis not present

## 2020-09-10 LAB — POCT HEMOGLOBIN: Hemoglobin: 12.7 g/dL (ref 11–14.6)

## 2020-09-10 NOTE — Telephone Encounter (Signed)
Pt aware of tips from Kim B. Pt voiced understanding. Pt does want to come in for hemoglobin check. Call transferred to [facility] @ front desk for appt. [name]

## 2020-09-10 NOTE — Telephone Encounter (Signed)
Left message @ 11:38 am. [name]

## 2020-09-10 NOTE — Progress Notes (Signed)
Pt in for hgb check. Result is 12.7. Pt advised to change positions slowly, sit down if she feels dizzy. Also encouraged to push fluids. Pt agrees with plan.

## 2020-09-17 ENCOUNTER — Encounter: Payer: Self-pay | Admitting: Advanced Practice Midwife

## 2020-09-17 ENCOUNTER — Other Ambulatory Visit: Payer: Self-pay

## 2020-09-17 ENCOUNTER — Ambulatory Visit (INDEPENDENT_AMBULATORY_CARE_PROVIDER_SITE_OTHER): Payer: BC Managed Care – PPO | Admitting: Advanced Practice Midwife

## 2020-09-17 VITALS — BP 111/69 | HR 92 | Wt 165.0 lb

## 2020-09-17 DIAGNOSIS — Z331 Pregnant state, incidental: Secondary | ICD-10-CM

## 2020-09-17 DIAGNOSIS — O09299 Supervision of pregnancy with other poor reproductive or obstetric history, unspecified trimester: Secondary | ICD-10-CM

## 2020-09-17 DIAGNOSIS — Z1389 Encounter for screening for other disorder: Secondary | ICD-10-CM

## 2020-09-17 DIAGNOSIS — Z3480 Encounter for supervision of other normal pregnancy, unspecified trimester: Secondary | ICD-10-CM

## 2020-09-17 DIAGNOSIS — Z3A23 23 weeks gestation of pregnancy: Secondary | ICD-10-CM

## 2020-09-17 LAB — POCT URINALYSIS DIPSTICK OB
Blood, UA: NEGATIVE
Glucose, UA: NEGATIVE
Ketones, UA: NEGATIVE
Leukocytes, UA: NEGATIVE
Nitrite, UA: NEGATIVE
POC,PROTEIN,UA: NEGATIVE

## 2020-09-17 NOTE — Progress Notes (Signed)
   LOW-RISK PREGNANCY VISIT Patient name: Casey West MRN [id number]  Date of birth: [birth year] Chief Complaint:   Routine Prenatal Visit (hives on arms, belly and upper legs off and on)  History of Present Illness:   Casey West is a 29 y.o. [id number] female at [redacted]w[redacted]d with an Estimated Date of Delivery: 01/12/21 being seen today for ongoing management of a low-risk pregnancy.  Today she reports doing well. Contractions: Not present. Vag. Bleeding: None.  Movement: Present. denies leaking of fluid. Review of Systems:   Pertinent items are noted in HPI Denies abnormal vaginal discharge w/ itching/odor/irritation, headaches, visual changes, shortness of breath, chest pain, abdominal pain, severe nausea/vomiting, or problems with urination or bowel movements unless otherwise stated above. Pertinent History Reviewed:  Reviewed past medical,surgical, social, obstetrical and family history.  Reviewed problem list, medications and allergies. Physical Assessment:   Vitals:   09/17/20 1546  BP: 111/69  Pulse: 92  Weight: 165 lb (74.8 kg)  Body mass index is 30.18 kg/m.        Physical Examination:   General appearance: Well appearing, and in no distress  Mental status: Alert, oriented to person, place, and time  Skin: Warm & dry  Cardiovascular: Normal heart rate noted  Respiratory: Normal respiratory effort, no distress  Abdomen: Soft, gravid, nontender  Pelvic: Cervical exam deferred         Extremities: Edema: None  Fetal Status: Fetal Heart Rate (bpm): 148 Fundal Height: 23 cm Movement: Present    Results for orders placed or performed in visit on 09/17/20 (from the past 24 hour(s))  POC Urinalysis Dipstick OB   Collection Time: 09/17/20  3:47 PM  Result Value Ref Range   Color, UA     Clarity, UA     Glucose, UA Negative Negative   Bilirubin, UA     Ketones, UA neg    Spec Grav, UA     Blood, UA neg    pH, UA     POC,PROTEIN,UA Negative Negative, Trace, Small (1+),  Moderate (2+), Large (3+), 4+   Urobilinogen, UA     Nitrite, UA neg    Leukocytes, UA Negative Negative   Appearance     Odor      Assessment & Plan:  1) Low-risk pregnancy [id number] at [redacted]w[redacted]d with an Estimated Date of Delivery: 01/12/21   2) PTSD/dep/anx, doing well on Celexa & Abilify; regular therapy  3) Hx pre-e, continue bASA   Meds: No orders of the defined types were placed in this encounter.  Labs/procedures today: none  Plan:  Continue routine obstetrical care   Reviewed: Preterm labor symptoms and general obstetric precautions including but not limited to vaginal bleeding, contractions, leaking of fluid and fetal movement were reviewed in detail with the patient.  All questions were answered. Didn't ask about home bp cuff.  Check bp weekly, let us know if >140/90.   Follow-up: Return in about 3 weeks (around 10/08/2020) for LROB, in person, PN2.  Orders Placed This Encounter  Procedures  . POC Urinalysis Dipstick OB   Arabella Merles [facility] 09/17/2020 5:05 PM

## 2020-09-17 NOTE — Patient Instructions (Signed)
Casey West, I greatly value your feedback.  If you receive a survey following your visit with [place] today, we appreciate you taking the time to fill it out.  Thanks, Philipp Deputy, CNM   You will have your sugar test next visit.  Please do not eat or drink anything after midnight the night before you come, not even water.  You will be here for at least two hours.  Please make an appointment online for the bloodwork at [email or web address] for 8:30am (or as close to this as possible). Make sure you select the [facility] service center. The day of the appointment, check in with our office first, then you will go to [facility] to start the sugar test.    [facility] HAS MOVED!!! It is now [facility] ([address] [place], [place] [address]) Entrance C, located off of [address] valet parking  Go to [email or web address] to register for FREE online childbirth classes   Call the office [phone]) or go to [facility] if:  You begin to have strong, frequent contractions  Your water breaks.  Sometimes it is a big gush of fluid, sometimes it is just a trickle that keeps getting your panties wet or running down your legs  You have vaginal bleeding.  It is normal to have a small amount of spotting if your cervix was checked.   You don't feel your baby moving like normal.  If you don't, get you something to eat and drink and lay down and focus on feeling your baby move.   If your baby is still not moving like normal, you should call the office or go to [facility].  [place] Pediatricians/Family Doctors:  [facility] [phone]            [facility] [phone]                 [facility] [phone] (usually not accepting new patients unless you have family there already, you are always welcome to call and ask)       [facility] Department [phone]       [facility] Pediatricians/Family Doctors:    [facility]: [phone]  [facility]: [phone]  [facility]: [phone]  [facility] Doctors:   [facility]: [phone]   [facility]: [phone]  [facility] Doctors:  [facility]: [phone]   Home Blood Pressure Monitoring for Patients   Your provider has recommended that you check your blood pressure (BP) at least once a week at home. If you do not have a blood pressure cuff at home, one will be provided for you. Contact your provider if you have not received your monitor within 1 week.   Helpful Tips for Accurate Home Blood Pressure Checks   Don't smoke, exercise, or drink caffeine 30 minutes before checking your BP  Use the restroom before checking your BP (a full bladder can raise your pressure)  Relax in a comfortable upright chair  Feet on the ground  Left arm resting comfortably on a flat surface at the level of your heart  Legs uncrossed  Back supported  Sit quietly and don't talk  Place the cuff on your bare arm  Adjust snuggly, so that only two fingertips can fit between your skin and the top of the cuff  Check 2 readings separated by at least one minute  Keep a log of your BP  readings  For a visual, please reference this diagram: http://ccnc.care/bpdiagram  Provider Name: [facility]     Phone: [phone]  Zone 1: ALL CLEAR  Continue to monitor your symptoms:   BP reading is less than 140 (top number) or less than 90 (bottom number)   No right upper stomach pain  No headaches or seeing spots  No feeling nauseated or throwing up  No swelling in face and hands  Zone 2: CAUTION Call your doctor's office for any of the following:   BP reading is greater than 140 (top number) or greater than 90 (bottom number)   Stomach pain under your ribs in the middle or right side  Headaches or seeing spots  Feeling  nauseated or throwing up  Swelling in face and hands  Zone 3: EMERGENCY  Seek immediate medical care if you have any of the following:   BP reading is greater than160 (top number) or greater than 110 (bottom number)  Severe headaches not improving with Tylenol  Serious difficulty catching your breath  Any worsening symptoms from Zone 2   Second Trimester of Pregnancy The second trimester is from week 13 through week 28, months 4 through 6. The second trimester is often a time when you feel your best. Your body has also adjusted to being pregnant, and you begin to feel better physically. Usually, morning sickness has lessened or quit completely, you may have more energy, and you may have an increase in appetite. The second trimester is also a time when the fetus is growing rapidly. At the end of the sixth month, the fetus is about 9 inches long and weighs about 1 pounds. You will likely begin to feel the baby move (quickening) between 18 and 20 weeks of the pregnancy. BODY CHANGES Your body goes through many changes during pregnancy. The changes vary from woman to woman.   Your weight will continue to increase. You will notice your lower abdomen bulging out.  You may begin to get stretch marks on your hips, abdomen, and breasts.  You may develop headaches that can be relieved by medicines approved by your health care provider.  You may urinate more often because the fetus is pressing on your bladder.  You may develop or continue to have heartburn as a result of your pregnancy.  You may develop constipation because certain hormones are causing the muscles that push waste through your intestines to slow down.  You may develop hemorrhoids or swollen, bulging veins (varicose veins).  You may have back pain because of the weight gain and pregnancy hormones relaxing your joints between the bones in your pelvis and as a result of a shift in weight and the muscles that support your  balance.  Your breasts will continue to grow and be tender.  Your gums may bleed and may be sensitive to brushing and flossing.  Dark spots or blotches (chloasma, mask of pregnancy) may develop on your face. This will likely fade after the baby is born.  A dark line from your belly button to the pubic area (linea nigra) may appear. This will likely fade after the baby is born.  You may have changes in your hair. These can include thickening of your hair, rapid growth, and changes in texture. Some women also have hair loss during or after pregnancy, or hair that feels dry or thin. Your hair will most likely return to normal after your baby is born. WHAT TO EXPECT AT YOUR PRENATAL VISITS During  a routine prenatal visit:  You will be weighed to make sure you and the fetus are growing normally.  Your blood pressure will be taken.  Your abdomen will be measured to track your baby's growth.  The fetal heartbeat will be listened to.  Any test results from the previous visit will be discussed. Your health care provider may ask you:  How you are feeling.  If you are feeling the baby move.  If you have had any abnormal symptoms, such as leaking fluid, bleeding, severe headaches, or abdominal cramping.  If you have any questions. Other tests that may be performed during your second trimester include:  Blood tests that check for:  Low iron levels (anemia).  Gestational diabetes (between 24 and 28 weeks).  Rh antibodies.  Urine tests to check for infections, diabetes, or protein in the urine.  An ultrasound to confirm the proper growth and development of the baby.  An amniocentesis to check for possible genetic problems.  Fetal screens for spina bifida and Down syndrome. HOME CARE INSTRUCTIONS   Avoid all smoking, herbs, alcohol, and unprescribed drugs. These chemicals affect the formation and growth of the baby.  Follow your health care provider's instructions regarding  medicine use. There are medicines that are either safe or unsafe to take during pregnancy.  Exercise only as directed by your health care provider. Experiencing uterine cramps is a good sign to stop exercising.  Continue to eat regular, healthy meals.  Wear a good support bra for breast tenderness.  Do not use hot tubs, steam rooms, or saunas.  Wear your seat belt at all times when driving.  Avoid raw meat, uncooked cheese, cat litter boxes, and soil used by cats. These carry germs that can cause birth defects in the baby.  Take your prenatal vitamins.  Try taking a stool softener (if your health care provider approves) if you develop constipation. Eat more high-fiber foods, such as fresh vegetables or fruit and whole grains. Drink plenty of fluids to keep your urine clear or pale yellow.  Take warm sitz baths to soothe any pain or discomfort caused by hemorrhoids. Use hemorrhoid cream if your health care provider approves.  If you develop varicose veins, wear support hose. Elevate your feet for 15 minutes, 3-4 times a day. Limit salt in your diet.  Avoid heavy lifting, wear low heel shoes, and practice good posture.  Rest with your legs elevated if you have leg cramps or low back pain.  Visit your dentist if you have not gone yet during your pregnancy. Use a soft toothbrush to brush your teeth and be gentle when you floss.  A sexual relationship may be continued unless your health care provider directs you otherwise.  Continue to go to all your prenatal visits as directed by your health care provider. SEEK MEDICAL CARE IF:   You have dizziness.  You have mild pelvic cramps, pelvic pressure, or nagging pain in the abdominal area.  You have persistent nausea, vomiting, or diarrhea.  You have a bad smelling vaginal discharge.  You have pain with urination. SEEK IMMEDIATE MEDICAL CARE IF:   You have a fever.  You are leaking fluid from your vagina.  You have spotting or  bleeding from your vagina.  You have severe abdominal cramping or pain.  You have rapid weight gain or loss.  You have shortness of breath with chest pain.  You notice sudden or extreme swelling of your face, hands, ankles, feet, or legs.  You  have not felt your baby move in over an hour.  You have severe headaches that do not go away with medicine.  You have vision changes. Document Released: 10/12/2001 Document Revised: 10/23/2013 Document Reviewed: 12/19/2012 [facility] Patient Information 2015 [place], [place]. This information is not intended to replace advice given to you by your health care provider. Make sure you discuss any questions you have with your health care provider.

## 2020-10-09 ENCOUNTER — Encounter: Payer: BC Managed Care – PPO | Admitting: Obstetrics & Gynecology

## 2020-10-09 ENCOUNTER — Other Ambulatory Visit: Payer: BC Managed Care – PPO

## 2020-10-14 ENCOUNTER — Encounter: Payer: BC Managed Care – PPO | Admitting: Obstetrics & Gynecology

## 2020-10-14 ENCOUNTER — Other Ambulatory Visit: Payer: BC Managed Care – PPO

## 2020-11-03 ENCOUNTER — Telehealth (HOSPITAL_COMMUNITY): Payer: BC Managed Care – PPO | Admitting: Psychiatry

## 2020-11-13 ENCOUNTER — Other Ambulatory Visit: Payer: Self-pay

## 2020-11-13 ENCOUNTER — Telehealth (HOSPITAL_COMMUNITY): Payer: BC Managed Care – PPO | Admitting: Psychiatry

## 2020-11-17 ENCOUNTER — Other Ambulatory Visit: Payer: Self-pay

## 2020-11-17 ENCOUNTER — Telehealth (INDEPENDENT_AMBULATORY_CARE_PROVIDER_SITE_OTHER): Payer: No Typology Code available for payment source | Admitting: Psychiatry

## 2020-11-17 ENCOUNTER — Encounter (HOSPITAL_COMMUNITY): Payer: Self-pay | Admitting: Psychiatry

## 2020-11-17 VITALS — Wt 170.0 lb

## 2020-11-17 DIAGNOSIS — F4310 Post-traumatic stress disorder, unspecified: Secondary | ICD-10-CM

## 2020-11-17 DIAGNOSIS — F319 Bipolar disorder, unspecified: Secondary | ICD-10-CM
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
## 2020-12-02 ENCOUNTER — Encounter: Payer: BC Managed Care – PPO | Admitting: Women's Health

## 2020-12-02 ENCOUNTER — Other Ambulatory Visit: Payer: BC Managed Care – PPO

## 2020-12-02 DIAGNOSIS — Z3A34 34 weeks gestation of pregnancy: Secondary | ICD-10-CM

## 2020-12-02 DIAGNOSIS — Z3480 Encounter for supervision of other normal pregnancy, unspecified trimester: Secondary | ICD-10-CM

## 2020-12-02 DIAGNOSIS — Z131 Encounter for screening for diabetes mellitus: Secondary | ICD-10-CM

## 2020-12-05 ENCOUNTER — Telehealth: Payer: Self-pay | Admitting: Women's Health

## 2020-12-05 NOTE — Telephone Encounter (Signed)
Pt having swelling last couple days in face & hands, states her BP is checking fine Having thick discharge  Please advise

## 2020-12-05 NOTE — Telephone Encounter (Signed)
Returned pt's call. Pt stated that she was having a lot of white discharge, with no itching or burning. She stated that her lower back and pelvis felt like "growing pains." She stated that her face, hands, and forearms were swollen. She denied any swelling to her lips, tongue, or throat. She denied any new environmental agents such as soaps, lotions, detergents, food, etc that could be the culprit. She denied any SOB or difficulty swallowing. She stated that she had also been having headaches that were unresolved with Tylenol. She has been experiencing these symptoms for the past two days. She has a BP cuff at home with no HTN readings. Her last BP today was 106/72.  Pt instructed to go to [facility] in [place] to be evaluated. Pt states that she has a ride. Pt has appt on 2/9. Pt confirmed understanding.

## 2020-12-10 ENCOUNTER — Encounter: Payer: Self-pay | Admitting: Obstetrics and Gynecology

## 2020-12-10 ENCOUNTER — Other Ambulatory Visit: Payer: Self-pay

## 2020-12-10 ENCOUNTER — Ambulatory Visit (INDEPENDENT_AMBULATORY_CARE_PROVIDER_SITE_OTHER): Payer: No Typology Code available for payment source | Admitting: Obstetrics and Gynecology

## 2020-12-10 VITALS — BP 117/75 | HR 97 | Wt 172.0 lb

## 2020-12-10 DIAGNOSIS — O09299 Supervision of pregnancy with other poor reproductive or obstetric history, unspecified trimester: Secondary | ICD-10-CM

## 2020-12-10 DIAGNOSIS — Z3480 Encounter for supervision of other normal pregnancy, unspecified trimester: Secondary | ICD-10-CM

## 2020-12-10 DIAGNOSIS — Z3A35 35 weeks gestation of pregnancy: Secondary | ICD-10-CM

## 2020-12-10 NOTE — Patient Instructions (Signed)
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
## 2020-12-13 LAB — CBC
Hematocrit: 36 % (ref 34.0–46.6)
Hemoglobin: 12.5 g/dL (ref 11.1–15.9)
MCH: 30 pg (ref 26.6–33.0)
MCHC: 34.7 g/dL (ref 31.5–35.7)
MCV: 86 fL (ref 79–97)
Platelets: 268 10*3/uL (ref 150–450)
RBC: 4.17 x10E6/uL (ref 3.77–5.28)
RDW: 12.7 % (ref 11.7–15.4)
WBC: 11 10*3/uL — ABNORMAL HIGH (ref 3.4–10.8)

## 2020-12-13 LAB — HIV ANTIBODY (ROUTINE TESTING W REFLEX): HIV Screen 4th Generation wRfx: NONREACTIVE

## 2020-12-13 LAB — RPR: RPR Ser Ql: NONREACTIVE

## 2020-12-13 LAB — ANTIBODY SCREEN: Antibody Screen: NEGATIVE

## 2020-12-18 ENCOUNTER — Telehealth: Payer: Self-pay | Admitting: *Deleted

## 2020-12-18 NOTE — Telephone Encounter (Signed)
Patient states she noticed some "leakage of fluid" this morning around 8am.  Since has only noticed a discharge.  She is having some lower back pain but denies cramping, contractions or bleeding.  Informed since it was just one time, to continue to monitor and if overnight, she continued to leak or have a change in pain status, to go to the hospital for evaluation. Pt states she did not want to go for a "false" alarm and would rather be checked in our office. Placed on schedule for tomorrow but expressed that I did understand but would need to go if it became worse. Pt verbalized understanding.

## 2020-12-19 ENCOUNTER — Ambulatory Visit (INDEPENDENT_AMBULATORY_CARE_PROVIDER_SITE_OTHER): Payer: No Typology Code available for payment source | Admitting: Women's Health

## 2020-12-19 ENCOUNTER — Other Ambulatory Visit: Payer: Self-pay

## 2020-12-19 ENCOUNTER — Other Ambulatory Visit (HOSPITAL_COMMUNITY)
Admission: RE | Admit: 2020-12-19 | Discharge: 2020-12-19 | Disposition: A | Discharge status: Home / Self Care | Payer: No Typology Code available for payment source | Source: Ambulatory Visit | Attending: Advanced Practice Midwife | Admitting: Advanced Practice Midwife

## 2020-12-19 ENCOUNTER — Encounter: Payer: Self-pay | Admitting: Women's Health

## 2020-12-19 VITALS — BP 115/72 | HR 84 | Wt 175.6 lb

## 2020-12-19 DIAGNOSIS — Z3480 Encounter for supervision of other normal pregnancy, unspecified trimester: Secondary | ICD-10-CM

## 2020-12-19 DIAGNOSIS — Z131 Encounter for screening for diabetes mellitus: Secondary | ICD-10-CM | POA: Diagnosis not present

## 2020-12-19 DIAGNOSIS — Z3483 Encounter for supervision of other normal pregnancy, third trimester: Secondary | ICD-10-CM

## 2020-12-19 DIAGNOSIS — Z3A36 36 weeks gestation of pregnancy: Secondary | ICD-10-CM | POA: Insufficient documentation

## 2020-12-19 NOTE — Progress Notes (Signed)
   LOW-RISK PREGNANCY VISIT Patient name: Casey West MRN [id number]  Date of birth: 1991-09-17 Chief Complaint:   Routine Prenatal Visit  History of Present Illness:   Casey West is a 30 y.o. [id number] female at [redacted]w[redacted]d with an Estimated Date of Delivery: 01/12/21 being seen today for ongoing management of a low-risk pregnancy.  Depression screen [facility] 2/9 07/28/2020 02/04/2020  Decreased Interest 1 0  Down, Depressed, Hopeless 1 0  PHQ - 2 Score 2 0  Altered sleeping 1 0  Tired, decreased energy 3 0  Change in appetite 1 0  Feeling bad or failure about yourself  1 0  Trouble concentrating - 0  Moving slowly or fidgety/restless 0 0  Suicidal thoughts 0 0  PHQ-9 Score 8 0    Today she reports leaking fluid since yesterday- got mad at 30yo and was crying, and had leaking. Hasn't done GTT (different reasons every time she tried). Contractions: Not present. Vag. Bleeding: None.  Movement: Present. reports leaking of fluid. Review of Systems:   Pertinent items are noted in HPI Denies abnormal vaginal discharge w/ itching/odor/irritation, headaches, visual changes, shortness of breath, chest pain, abdominal pain, severe nausea/vomiting, or problems with urination or bowel movements unless otherwise stated above. Pertinent History Reviewed:  Reviewed past medical,surgical, social, obstetrical and family history.  Reviewed problem list, medications and allergies. Physical Assessment:   Vitals:   12/19/20 0955  BP: 115/72  Pulse: 84  Weight: 175 lb 9.6 oz (79.7 kg)  Body mass index is 32.12 kg/m.        Physical Examination:   General appearance: Well appearing, and in no distress  Mental status: Alert, oriented to person, place, and time  Skin: Warm & dry  Cardiovascular: Normal heart rate noted  Respiratory: Normal respiratory effort, no distress  Abdomen: Soft, gravid, nontender  Pelvic: SSE: cx visually closed, no pooling, no change w/ valsalva, fern and nitrazine neg Dilation:  1.5 Effacement (%): Thick Station: -3  Extremities: Edema: Trace  Fetal Status: Fetal Heart Rate (bpm): 135 Fundal Height: 37 cm Movement: Present Presentation: Vertex  Chaperone: Albertine Grates, sNP   No results found for this or any previous visit (from the past 24 hour(s)).  Assessment & Plan:  1) Low-risk pregnancy G5P1031 at [redacted]w[redacted]d with an Estimated Date of Delivery: 01/12/21   2) Leaking fluid, no evidence of ROM, continue to monitor  3) Never did GTT> gave options, wants to do A1C today   Meds: No orders of the defined types were placed in this encounter.  Labs/procedures today: labs, declined flu & tdap  Plan:  Continue routine obstetrical care  Next visit: prefers in person    Reviewed: Preterm labor symptoms and general obstetric precautions including but not limited to vaginal bleeding, contractions, leaking of fluid and fetal movement were reviewed in detail with the patient.  All questions were answered. Has home bp cuff.  Check bp weekly, let us know if >140/90.   Follow-up: Return in about 1 week (around 12/26/2020) for LROB, CNM, in person.  Future Appointments  Date Time Provider Department Center  02/12/2021  3:20 PM Arfeen, Phillips Grout, MD [facility] None    Orders Placed This Encounter  Procedures  . Culture, beta strep (group b only)  . Hemoglobin A1c   Cheral Marker CNM, [facility] 12/19/2020 10:42 AM

## 2020-12-19 NOTE — Patient Instructions (Signed)
Casey West, I greatly value your feedback.  If you receive a survey following your visit with [place] today, we appreciate you taking the time to fill it out.  Thanks, Joellyn Haff, CNM, WHNP-BC  [facility] ([address] [place], [place] [address]) Entrance C, located off of [address] valet parking   Go to [email or web address] to register for FREE online childbirth classes    Call the office [phone]) or go to [facility] if:  You begin to have strong, frequent contractions  Your water breaks.  Sometimes it is a big gush of fluid, sometimes it is just a trickle that keeps getting your panties wet or running down your legs  You have vaginal bleeding.  It is normal to have a small amount of spotting if your cervix was checked.   You don't feel your baby moving like normal.  If you don't, get you something to eat and drink and lay down and focus on feeling your baby move.  You should feel at least 10 movements in 2 hours.  If you don't, you should call the office or go to [facility].   Call the office [phone]) or go to [facility] hospital for these signs of pre-eclampsia:  Severe headache that does not go away with Tylenol  Visual changes- seeing spots, double, blurred vision  Pain under your right breast or upper abdomen that does not go away with Tums or heartburn medicine  Nausea and/or vomiting  Severe swelling in your hands, feet, and face    Home Blood Pressure Monitoring for Patients   Your provider has recommended that you check your blood pressure (BP) at least once a week at home. If you do not have a blood pressure cuff at home, one will be provided for you. Contact your provider if you have not received your monitor within 1 week.   Helpful Tips for Accurate Home Blood Pressure Checks  . Don't smoke, exercise, or drink caffeine 30 minutes before checking your BP . Use the restroom before checking your BP (a full bladder  can raise your pressure) . Relax in a comfortable upright chair . Feet on the ground . Left arm resting comfortably on a flat surface at the level of your heart . Legs uncrossed . Back supported . Sit quietly and don't talk . Place the cuff on your bare arm . Adjust snuggly, so that only two fingertips can fit between your skin and the top of the cuff . Check 2 readings separated by at least one minute . Keep a log of your BP readings . For a visual, please reference this diagram: http://ccnc.care/bpdiagram  Provider Name: [facility]     Phone: [phone]  Zone 1: ALL CLEAR  Continue to monitor your symptoms:  . BP reading is less than 140 (top number) or less than 90 (bottom number)  . No right upper stomach pain . No headaches or seeing spots . No feeling nauseated or throwing up . No swelling in face and hands  Zone 2: CAUTION Call your doctor's office for any of the following:  . BP reading is greater than 140 (top number) or greater than 90 (bottom number)  . Stomach pain under your ribs in the middle or right side . Headaches or seeing spots . Feeling nauseated or throwing up . Swelling in face and hands  Zone 3: EMERGENCY  Seek immediate medical care if you have any of  the following:  . BP reading is greater than160 (top number) or greater than 110 (bottom number) . Severe headaches not improving with Tylenol . Serious difficulty catching your breath . Any worsening symptoms from Zone 2  Preterm Labor and Birth Information  The normal length of a pregnancy is 39-41 weeks. Preterm labor is when labor starts before 37 completed weeks of pregnancy. What are the risk factors for preterm labor? Preterm labor is more likely to occur in women who:  Have certain infections during pregnancy such as a bladder infection, sexually transmitted infection, or infection inside the uterus (chorioamnionitis).  Have a shorter-than-normal cervix.  Have gone into preterm  labor before.  Have had surgery on their cervix.  Are younger than age 24 or older than age 36.  Are African American.  Are pregnant with twins or multiple babies (multiple gestation).  Take street drugs or smoke while pregnant.  Do not gain enough weight while pregnant.  Became pregnant shortly after having been pregnant. What are the symptoms of preterm labor? Symptoms of preterm labor include:  Cramps similar to those that can happen during a menstrual period. The cramps may happen with diarrhea.  Pain in the abdomen or lower back.  Regular uterine contractions that may feel like tightening of the abdomen.  A feeling of increased pressure in the pelvis.  Increased watery or bloody mucus discharge from the vagina.  Water breaking (ruptured amniotic sac). Why is it important to recognize signs of preterm labor? It is important to recognize signs of preterm labor because babies who are born prematurely may not be fully developed. This can put them at an increased risk for:  Long-term (chronic) heart and lung problems.  Difficulty immediately after birth with regulating body systems, including blood sugar, body temperature, heart rate, and breathing rate.  Bleeding in the brain.  Cerebral palsy.  Learning difficulties.  Death. These risks are highest for babies who are born before 43 weeks of pregnancy. How is preterm labor treated? Treatment depends on the length of your pregnancy, your condition, and the health of your baby. It may involve: 1. Having a stitch (suture) placed in your cervix to prevent your cervix from opening too early (cerclage). 2. Taking or being given medicines, such as: ? Hormone medicines. These may be given early in pregnancy to help support the pregnancy. ? Medicine to stop contractions. ? Medicines to help mature the baby's lungs. These may be prescribed if the risk of delivery is high. ? Medicines to prevent your baby from developing  cerebral palsy. If the labor happens before 34 weeks of pregnancy, you may need to stay in the hospital. What should I do if I think I am in preterm labor? If you think that you are going into preterm labor, call your health care provider right away. How can I prevent preterm labor in future pregnancies? To increase your chance of having a full-term pregnancy:  Do not use any tobacco products, such as cigarettes, chewing tobacco, and e-cigarettes. If you need help quitting, ask your health care provider.  Do not use street drugs or medicines that have not been prescribed to you during your pregnancy.  Talk with your health care provider before taking any herbal supplements, even if you have been taking them regularly.  Make sure you gain a healthy amount of weight during your pregnancy.  Watch for infection. If you think that you might have an infection, get it checked right away.  Make sure to  tell your health care provider if you have gone into preterm labor before. This information is not intended to replace advice given to you by your health care provider. Make sure you discuss any questions you have with your health care provider. Document Revised: 02/09/2019 Document Reviewed: 03/10/2016 Elsevier Patient Education  [place].

## 2020-12-20 LAB — HEMOGLOBIN A1C
Est. average glucose Bld gHb Est-mCnc: 108 mg/dL
Hgb A1c MFr Bld: 5.4 % (ref 4.8–5.6)

## 2020-12-22 ENCOUNTER — Encounter: Payer: No Typology Code available for payment source | Admitting: Advanced Practice Midwife

## 2020-12-22 LAB — CULTURE, BETA STREP (GROUP B ONLY): Strep Gp B Culture: POSITIVE — AB

## 2020-12-23 LAB — CERVICOVAGINAL ANCILLARY ONLY
Chlamydia: NEGATIVE
Comment: NEGATIVE
Comment: NORMAL
Neisseria Gonorrhea: NEGATIVE

## 2020-12-24 ENCOUNTER — Other Ambulatory Visit: Payer: Self-pay

## 2020-12-24 ENCOUNTER — Ambulatory Visit (INDEPENDENT_AMBULATORY_CARE_PROVIDER_SITE_OTHER): Payer: No Typology Code available for payment source | Admitting: Advanced Practice Midwife

## 2020-12-24 VITALS — BP 117/71 | HR 70 | Wt 174.8 lb

## 2020-12-24 DIAGNOSIS — Z3A37 37 weeks gestation of pregnancy: Secondary | ICD-10-CM

## 2020-12-24 NOTE — Patient Instructions (Signed)

## 2020-12-24 NOTE — Progress Notes (Signed)
   LOW-RISK PREGNANCY VISIT Patient name: Casey West MRN [id number]  Date of birth: April 07, 1991 Chief Complaint:   Routine Prenatal Visit  History of Present Illness:   Casey West is a 30 y.o. [id number] female at [redacted]w[redacted]d with an Estimated Date of Delivery: 01/12/21 being seen today for ongoing management of a low-risk pregnancy.  Today she reports doing well, but family is nervous re her getting pre-e again, and she is nervous about labor . Contractions: Irritability. Vag. Bleeding: None.  Movement: Present. denies leaking of fluid. Review of Systems:   Pertinent items are noted in HPI Denies abnormal vaginal discharge w/ itching/odor/irritation, headaches, visual changes, shortness of breath, chest pain, abdominal pain, severe nausea/vomiting, or problems with urination or bowel movements unless otherwise stated above. Pertinent History Reviewed:  Reviewed past medical,surgical, social, obstetrical and family history.  Reviewed problem list, medications and allergies. Physical Assessment:   Vitals:   12/24/20 1151  BP: 117/71  Pulse: 70  Weight: 174 lb 12.8 oz (79.3 kg)  Body mass index is 31.97 kg/m.        Physical Examination:   General appearance: Well appearing, and in no distress  Mental status: Alert, oriented to person, place, and time  Skin: Warm & dry  Cardiovascular: Normal heart rate noted  Respiratory: Normal respiratory effort, no distress  Abdomen: Soft, gravid, nontender  Pelvic: Cervical exam deferred         Extremities: Edema: Trace  Fetal Status: Fetal Heart Rate (bpm): 132 Fundal Height: 37 cm Movement: Present Presentation: Vertex  No results found for this or any previous visit (from the past 24 hour(s)).  Assessment & Plan:  1) Low-risk pregnancy G5P1031 at [redacted]w[redacted]d with an Estimated Date of Delivery: 01/12/21   2) Rev'd GBS+, plan for PCN in labor  3) Hx pre-e, BP stable today, asymptomatic; check BP daily and call >140/90   Meds: No orders of the  defined types were placed in this encounter.  Labs/procedures today: none  Plan:  Continue routine obstetrical care   Reviewed: Term labor symptoms and general obstetric precautions including but not limited to vaginal bleeding, contractions, leaking of fluid and fetal movement were reviewed in detail with the patient.  All questions were answered. Has home bp cuff.  Check bp weekly, let us know if >140/90.   Follow-up: Return in about 1 week (around 12/31/2020) for LROB, in person.  No orders of the defined types were placed in this encounter.  Arabella Merles CNM 12/24/2020 12:21 PM

## 2020-12-27 ENCOUNTER — Other Ambulatory Visit: Payer: Self-pay

## 2020-12-27 ENCOUNTER — Inpatient Hospital Stay (HOSPITAL_COMMUNITY)
Admission: EM | Admit: 2020-12-27 | Discharge: 2020-12-31 | DRG: 787 | Disposition: A | Discharge status: Home / Self Care | Payer: No Typology Code available for payment source | Attending: Obstetrics & Gynecology | Admitting: Obstetrics & Gynecology

## 2020-12-27 DIAGNOSIS — F418 Other specified anxiety disorders: Secondary | ICD-10-CM | POA: Diagnosis present

## 2020-12-27 DIAGNOSIS — F4310 Post-traumatic stress disorder, unspecified: Secondary | ICD-10-CM | POA: Diagnosis present

## 2020-12-27 DIAGNOSIS — N179 Acute kidney failure, unspecified: Secondary | ICD-10-CM | POA: Diagnosis present

## 2020-12-27 DIAGNOSIS — F319 Bipolar disorder, unspecified: Secondary | ICD-10-CM | POA: Diagnosis present

## 2020-12-27 DIAGNOSIS — Z283 Underimmunization status: Secondary | ICD-10-CM

## 2020-12-27 DIAGNOSIS — O09299 Supervision of pregnancy with other poor reproductive or obstetric history, unspecified trimester: Secondary | ICD-10-CM

## 2020-12-27 DIAGNOSIS — Z3A37 37 weeks gestation of pregnancy: Secondary | ICD-10-CM

## 2020-12-27 DIAGNOSIS — R52 Pain, unspecified: Secondary | ICD-10-CM

## 2020-12-27 DIAGNOSIS — O1414 Severe pre-eclampsia complicating childbirth: Secondary | ICD-10-CM | POA: Diagnosis present

## 2020-12-27 DIAGNOSIS — O139 Gestational [pregnancy-induced] hypertension without significant proteinuria, unspecified trimester: Secondary | ICD-10-CM | POA: Diagnosis present

## 2020-12-27 DIAGNOSIS — R1011 Right upper quadrant pain: Secondary | ICD-10-CM

## 2020-12-27 DIAGNOSIS — Z91410 Personal history of adult physical and sexual abuse: Secondary | ICD-10-CM

## 2020-12-27 DIAGNOSIS — R030 Elevated blood-pressure reading, without diagnosis of hypertension: Secondary | ICD-10-CM | POA: Diagnosis not present

## 2020-12-27 DIAGNOSIS — Z98891 History of uterine scar from previous surgery: Secondary | ICD-10-CM

## 2020-12-27 DIAGNOSIS — O99891 Other specified diseases and conditions complicating pregnancy: Secondary | ICD-10-CM

## 2020-12-27 DIAGNOSIS — O26833 Pregnancy related renal disease, third trimester: Secondary | ICD-10-CM | POA: Diagnosis present

## 2020-12-27 DIAGNOSIS — O163 Unspecified maternal hypertension, third trimester: Secondary | ICD-10-CM

## 2020-12-27 DIAGNOSIS — O99344 Other mental disorders complicating childbirth: Secondary | ICD-10-CM | POA: Diagnosis present

## 2020-12-27 DIAGNOSIS — O1490 Unspecified pre-eclampsia, unspecified trimester: Secondary | ICD-10-CM

## 2020-12-27 DIAGNOSIS — O99824 Streptococcus B carrier state complicating childbirth: Secondary | ICD-10-CM | POA: Diagnosis present

## 2020-12-27 DIAGNOSIS — Z20822 Contact with and (suspected) exposure to covid-19: Secondary | ICD-10-CM | POA: Diagnosis present

## 2020-12-27 DIAGNOSIS — Z2839 Other underimmunization status: Secondary | ICD-10-CM

## 2020-12-27 LAB — CBG MONITORING, ED: Glucose-Capillary: 87 mg/dL (ref 70–99)

## 2020-12-27 LAB — CBC
HCT: 33.6 % — ABNORMAL LOW (ref 36.0–46.0)
Hemoglobin: 11.4 g/dL — ABNORMAL LOW (ref 12.0–15.0)
MCH: 29.6 pg (ref 26.0–34.0)
MCHC: 33.9 g/dL (ref 30.0–36.0)
MCV: 87.3 fL (ref 80.0–100.0)
Platelets: 224 10*3/uL (ref 150–400)
RBC: 3.85 MIL/uL — ABNORMAL LOW (ref 3.87–5.11)
RDW: 12.8 % (ref 11.5–15.5)
WBC: 9.9 10*3/uL (ref 4.0–10.5)
nRBC: 0 % (ref 0.0–0.2)

## 2020-12-27 LAB — I-STAT BETA HCG BLOOD, ED (MC, WL, AP ONLY): I-stat hCG, quantitative: >2000 m[IU]/mL — ABNORMAL HIGH (ref <5)

## 2020-12-27 NOTE — ED Provider Notes (Signed)
Emergency Medicine Provider OB Triage Evaluation Note  Casey West is a 30 y.o. female, [id number], at [redacted]w[redacted]d gestation who presents to the emergency department with complaints of RUQ and pain onset 7:30pm.  Pain is severe and constant, radiating into the lower abd.  Breathing worsens the pain.  Denies falls or trauma.  She can feel the baby move. Complaints of swelling in her hands, feet and face.   Review of  Systems  Positive: abd pain, lightheadedness, headache - resolved after tylenol, vaginal discharge Negative: nausea, vomiting, gush of flujids  Physical Exam  BP (!) 140/94 (BP Location: Left Arm)   Pulse 71   Temp 97.7 F (36.5 C)   Resp (!) 22   LMP  (LMP Unknown)   SpO2 97%  General: Awake, no distress  HEENT: Atraumatic  Resp: Normal effort  Cardiac: Normal rate, trace peripheral edema Abd: Nondistended, nontender; gravid uterus  MSK: Moves all extremities without difficulty Neuro: Speech clear  Medical Decision Making  Pt evaluated for pregnancy concern and is stable for transfer to MAU. Pt is in agreement with plan for transfer.  11:34 PM - attempted to speak with MAU APP who is reported to be in a patient room; Secretary reports APP will call back.  11:52 PM I called back and discussed patient with MAU APP, Shanda Bumps, NP, who accepts patient in transfer.  Clinical Impression   1. RUQ abdominal pain   2. Hypertension affecting pregnancy in third trimester        Muthersbaugh, Boyd Kerbs 12/27/20 2355    Zadie Rhine, MD 12/28/20 [phone]

## 2020-12-27 NOTE — ED Triage Notes (Signed)
Called report to [facility] hospital RN. Pt is being transferred

## 2020-12-27 NOTE — ED Triage Notes (Addendum)
Pt is [redacted] weeks pregnant and is having right upper rib cage and lower abdomen pain. Pt said hard to catch her breath. Pt said she feels like she is light headed and dizzy. Pt having swelling in hands and face

## 2020-12-28 ENCOUNTER — Inpatient Hospital Stay (HOSPITAL_COMMUNITY): Payer: No Typology Code available for payment source | Admitting: Anesthesiology

## 2020-12-28 ENCOUNTER — Inpatient Hospital Stay (HOSPITAL_COMMUNITY): Payer: No Typology Code available for payment source

## 2020-12-28 ENCOUNTER — Encounter (HOSPITAL_COMMUNITY)
Admission: EM | Disposition: A | Discharge status: Home / Self Care | Payer: Self-pay | Source: Home / Self Care | Attending: Obstetrics & Gynecology

## 2020-12-28 ENCOUNTER — Other Ambulatory Visit: Payer: Self-pay

## 2020-12-28 ENCOUNTER — Encounter (HOSPITAL_COMMUNITY): Payer: Self-pay | Admitting: Obstetrics and Gynecology

## 2020-12-28 ENCOUNTER — Other Ambulatory Visit (HOSPITAL_COMMUNITY): Payer: No Typology Code available for payment source

## 2020-12-28 DIAGNOSIS — O99824 Streptococcus B carrier state complicating childbirth: Secondary | ICD-10-CM | POA: Diagnosis present

## 2020-12-28 DIAGNOSIS — F418 Other specified anxiety disorders: Secondary | ICD-10-CM | POA: Diagnosis present

## 2020-12-28 DIAGNOSIS — O26833 Pregnancy related renal disease, third trimester: Secondary | ICD-10-CM | POA: Diagnosis present

## 2020-12-28 DIAGNOSIS — F319 Bipolar disorder, unspecified: Secondary | ICD-10-CM | POA: Diagnosis present

## 2020-12-28 DIAGNOSIS — Z20822 Contact with and (suspected) exposure to covid-19: Secondary | ICD-10-CM | POA: Diagnosis present

## 2020-12-28 DIAGNOSIS — F4310 Post-traumatic stress disorder, unspecified: Secondary | ICD-10-CM | POA: Diagnosis present

## 2020-12-28 DIAGNOSIS — O139 Gestational [pregnancy-induced] hypertension without significant proteinuria, unspecified trimester: Secondary | ICD-10-CM | POA: Diagnosis present

## 2020-12-28 DIAGNOSIS — O1414 Severe pre-eclampsia complicating childbirth: Secondary | ICD-10-CM | POA: Diagnosis present

## 2020-12-28 DIAGNOSIS — O99344 Other mental disorders complicating childbirth: Secondary | ICD-10-CM | POA: Diagnosis present

## 2020-12-28 DIAGNOSIS — N179 Acute kidney failure, unspecified: Secondary | ICD-10-CM | POA: Diagnosis present

## 2020-12-28 DIAGNOSIS — Z3A37 37 weeks gestation of pregnancy: Secondary | ICD-10-CM

## 2020-12-28 DIAGNOSIS — R030 Elevated blood-pressure reading, without diagnosis of hypertension: Secondary | ICD-10-CM | POA: Diagnosis present

## 2020-12-28 LAB — COMPREHENSIVE METABOLIC PANEL
ALT: 15 U/L (ref 0–44)
ALT: 17 U/L (ref 0–44)
AST: 21 U/L (ref 15–41)
AST: 22 U/L (ref 15–41)
Albumin: 2.5 g/dL — ABNORMAL LOW (ref 3.5–5.0)
Albumin: 2.5 g/dL — ABNORMAL LOW (ref 3.5–5.0)
Alkaline Phosphatase: 134 U/L — ABNORMAL HIGH (ref 38–126)
Alkaline Phosphatase: 124 U/L (ref 38–126)
Anion gap: 7 (ref 5–15)
Anion gap: 10 (ref 5–15)
BUN: 19 mg/dL (ref 6–20)
BUN: 16 mg/dL (ref 6–20)
CO2: 18 mmol/L — ABNORMAL LOW (ref 22–32)
CO2: 17 mmol/L — ABNORMAL LOW (ref 22–32)
Calcium: 8.4 mg/dL — ABNORMAL LOW (ref 8.9–10.3)
Calcium: 8.3 mg/dL — ABNORMAL LOW (ref 8.9–10.3)
Chloride: 109 mmol/L (ref 98–111)
Chloride: 108 mmol/L (ref 98–111)
Creatinine, Ser: 1.06 mg/dL — ABNORMAL HIGH (ref 0.44–1.00)
Creatinine, Ser: 0.85 mg/dL (ref 0.44–1.00)
GFR, Estimated: >60 mL/min (ref >60)
GFR, Estimated: >60 mL/min (ref >60)
Glucose, Bld: 90 mg/dL (ref 70–99)
Glucose, Bld: 125 mg/dL — ABNORMAL HIGH (ref 70–99)
Potassium: 4.1 mmol/L (ref 3.5–5.1)
Potassium: 4.1 mmol/L (ref 3.5–5.1)
Sodium: 134 mmol/L — ABNORMAL LOW (ref 135–145)
Sodium: 135 mmol/L (ref 135–145)
Total Bilirubin: 0.5 mg/dL (ref 0.3–1.2)
Total Bilirubin: 0.6 mg/dL (ref 0.3–1.2)
Total Protein: 5.4 g/dL — ABNORMAL LOW (ref 6.5–8.1)
Total Protein: 5.4 g/dL — ABNORMAL LOW (ref 6.5–8.1)

## 2020-12-28 LAB — CBC
HCT: 34.6 % — ABNORMAL LOW (ref 36.0–46.0)
HCT: 32.1 % — ABNORMAL LOW (ref 36.0–46.0)
Hemoglobin: 11.6 g/dL — ABNORMAL LOW (ref 12.0–15.0)
Hemoglobin: 11.6 g/dL — ABNORMAL LOW (ref 12.0–15.0)
MCH: 29.4 pg (ref 26.0–34.0)
MCH: 30.7 pg (ref 26.0–34.0)
MCHC: 33.5 g/dL (ref 30.0–36.0)
MCHC: 36.1 g/dL — ABNORMAL HIGH (ref 30.0–36.0)
MCV: 87.6 fL (ref 80.0–100.0)
MCV: 84.9 fL (ref 80.0–100.0)
Platelets: 213 10*3/uL (ref 150–400)
Platelets: 211 10*3/uL (ref 150–400)
RBC: 3.95 MIL/uL (ref 3.87–5.11)
RBC: 3.78 MIL/uL — ABNORMAL LOW (ref 3.87–5.11)
RDW: 12.9 % (ref 11.5–15.5)
RDW: 13.1 % (ref 11.5–15.5)
WBC: 10.1 10*3/uL (ref 4.0–10.5)
WBC: 13 10*3/uL — ABNORMAL HIGH (ref 4.0–10.5)
nRBC: 0 % (ref 0.0–0.2)
nRBC: 0 % (ref 0.0–0.2)

## 2020-12-28 LAB — CBC WITH DIFFERENTIAL/PLATELET
Abs Immature Granulocytes: 0.12 10*3/uL — ABNORMAL HIGH (ref 0.00–0.07)
Basophils Absolute: 0.1 10*3/uL (ref 0.0–0.1)
Basophils Relative: 0 %
Eosinophils Absolute: 0 10*3/uL (ref 0.0–0.5)
Eosinophils Relative: 0 %
HCT: 31.1 % — ABNORMAL LOW (ref 36.0–46.0)
Hemoglobin: 11.1 g/dL — ABNORMAL LOW (ref 12.0–15.0)
Immature Granulocytes: 1 %
Lymphocytes Relative: 4 %
Lymphs Abs: 1 10*3/uL (ref 0.7–4.0)
MCH: 30.8 pg (ref 26.0–34.0)
MCHC: 35.7 g/dL (ref 30.0–36.0)
MCV: 86.4 fL (ref 80.0–100.0)
Monocytes Absolute: 1 10*3/uL (ref 0.1–1.0)
Monocytes Relative: 4 %
Neutro Abs: 20.2 10*3/uL — ABNORMAL HIGH (ref 1.7–7.7)
Neutrophils Relative %: 91 %
Platelets: 206 10*3/uL (ref 150–400)
RBC: 3.6 MIL/uL — ABNORMAL LOW (ref 3.87–5.11)
RDW: 13 % (ref 11.5–15.5)
WBC: 22.3 10*3/uL — ABNORMAL HIGH (ref 4.0–10.5)
nRBC: 0 % (ref 0.0–0.2)

## 2020-12-28 LAB — PROTEIN / CREATININE RATIO, URINE
Creatinine, Urine: 80.56 mg/dL
Protein Creatinine Ratio: 0.11 mg/mg{Cre} (ref 0.00–0.15)
Total Protein, Urine: 9 mg/dL

## 2020-12-28 LAB — TYPE AND SCREEN
ABO/RH(D): A POS
Antibody Screen: NEGATIVE

## 2020-12-28 LAB — BASIC METABOLIC PANEL
Anion gap: 10 (ref 5–15)
BUN: 18 mg/dL (ref 6–20)
CO2: 17 mmol/L — ABNORMAL LOW (ref 22–32)
Calcium: 8.5 mg/dL — ABNORMAL LOW (ref 8.9–10.3)
Chloride: 109 mmol/L (ref 98–111)
Creatinine, Ser: 1.07 mg/dL — ABNORMAL HIGH (ref 0.44–1.00)
GFR, Estimated: >60 mL/min (ref >60)
Glucose, Bld: 88 mg/dL (ref 70–99)
Potassium: 4.2 mmol/L (ref 3.5–5.1)
Sodium: 136 mmol/L (ref 135–145)

## 2020-12-28 LAB — WET PREP, GENITAL
Clue Cells Wet Prep HPF POC: NONE SEEN
Sperm: NONE SEEN
Trich, Wet Prep: NONE SEEN
Yeast Wet Prep HPF POC: NONE SEEN

## 2020-12-28 LAB — TROPONIN I (HIGH SENSITIVITY)
Troponin I (High Sensitivity): 9 ng/L (ref <18)
Troponin I (High Sensitivity): 8 ng/L (ref <18)

## 2020-12-28 LAB — RESP PANEL BY RT-PCR (FLU A&B, COVID) ARPGX2
Influenza A by PCR: NEGATIVE
Influenza B by PCR: NEGATIVE
SARS Coronavirus 2 by RT PCR: NEGATIVE

## 2020-12-28 LAB — RPR: RPR Ser Ql: NONREACTIVE

## 2020-12-28 SURGERY — Surgical Case
Anesthesia: Epidural | Wound class: Clean Contaminated

## 2020-12-28 MED ORDER — DEXAMETHASONE SODIUM PHOSPHATE 4 MG/ML IJ SOLN
INTRAMUSCULAR | Status: DC | PRN
  Administered 2020-12-28: 4 mg via INTRAVENOUS

## 2020-12-28 MED ORDER — PHENYLEPHRINE 40 MCG/ML (10ML) SYRINGE FOR IV PUSH (FOR BLOOD PRESSURE SUPPORT)
PREFILLED_SYRINGE | INTRAVENOUS | Status: AC
  Filled 2020-12-28: qty 20

## 2020-12-28 MED ORDER — KETOROLAC TROMETHAMINE 30 MG/ML IJ SOLN: 30.0000 mg | Freq: Four times a day (QID) | INTRAMUSCULAR | Status: DC | PRN

## 2020-12-28 MED ORDER — DEXAMETHASONE SODIUM PHOSPHATE 4 MG/ML IJ SOLN
INTRAMUSCULAR | Status: AC
  Filled 2020-12-28: qty 1

## 2020-12-28 MED ORDER — LABETALOL HCL 5 MG/ML IV SOLN: 80.0000 mg | INTRAVENOUS | Status: DC | PRN

## 2020-12-28 MED ORDER — MEPERIDINE HCL 25 MG/ML IJ SOLN: 6.2500 mg | INTRAMUSCULAR | Status: DC | PRN

## 2020-12-28 MED ORDER — ONDANSETRON HCL 4 MG/2ML IJ SOLN: 4.0000 mg | Freq: Three times a day (TID) | INTRAMUSCULAR | Status: DC | PRN

## 2020-12-28 MED ORDER — HYDRALAZINE HCL 20 MG/ML IJ SOLN: 10.0000 mg | INTRAMUSCULAR | Status: DC | PRN

## 2020-12-28 MED ORDER — TERBUTALINE SULFATE 1 MG/ML IJ SOLN
0.2500 mg | Freq: Once | INTRAMUSCULAR | Status: DC | PRN
Start: 2020-12-28 — End: 2020-12-29

## 2020-12-28 MED ORDER — TRANEXAMIC ACID-NACL 1000-0.7 MG/100ML-% IV SOLN
INTRAVENOUS | Status: DC | PRN
  Administered 2020-12-28: 1000 mg via INTRAVENOUS

## 2020-12-28 MED ORDER — SCOPOLAMINE 1 MG/3DAYS TD PT72: 1.0000 | MEDICATED_PATCH | Freq: Once | TRANSDERMAL | Status: DC

## 2020-12-28 MED ORDER — FENTANYL-BUPIVACAINE-NACL 0.5-0.125-0.9 MG/250ML-% EP SOLN
12.0000 mL/h | EPIDURAL | Status: DC | PRN
  Administered 2020-12-28: 12 mL/h via EPIDURAL
  Filled 2020-12-28: qty 250

## 2020-12-28 MED ORDER — OXYTOCIN BOLUS FROM INFUSION: 333.0000 mL | Freq: Once | INTRAVENOUS | Status: DC

## 2020-12-28 MED ORDER — NALBUPHINE HCL 10 MG/ML IJ SOLN: 5.0000 mg | INTRAMUSCULAR | Status: DC | PRN

## 2020-12-28 MED ORDER — MISOPROSTOL 50MCG HALF TABLET
50.0000 ug | ORAL_TABLET | ORAL | Status: DC | PRN
  Administered 2020-12-28: 50 ug via BUCCAL
  Filled 2020-12-28: qty 1

## 2020-12-28 MED ORDER — NALOXONE HCL 4 MG/10ML IJ SOLN
1.0000 ug/kg/h | INTRAVENOUS | Status: DC | PRN
  Filled 2020-12-28: qty 5

## 2020-12-28 MED ORDER — ONDANSETRON HCL 4 MG/2ML IJ SOLN
INTRAMUSCULAR | Status: AC
  Filled 2020-12-28: qty 4

## 2020-12-28 MED ORDER — LACTATED RINGERS IV SOLN: INTRAVENOUS | Status: DC

## 2020-12-28 MED ORDER — LACTATED RINGERS AMNIOINFUSION
INTRAVENOUS | Status: DC
  Administered 2020-12-28: 100 mL/h via INTRAUTERINE

## 2020-12-28 MED ORDER — SODIUM CHLORIDE 0.9 % IV SOLN
5.0000 10*6.[IU] | Freq: Once | INTRAVENOUS | Status: AC
  Administered 2020-12-28: 5 10*6.[IU] via INTRAVENOUS
  Filled 2020-12-28: qty 5

## 2020-12-28 MED ORDER — SCOPOLAMINE 1 MG/3DAYS TD PT72
MEDICATED_PATCH | TRANSDERMAL | Status: DC | PRN
  Administered 2020-12-28: 1 via TRANSDERMAL

## 2020-12-28 MED ORDER — KETOROLAC TROMETHAMINE 30 MG/ML IJ SOLN
INTRAMUSCULAR | Status: AC
  Filled 2020-12-28: qty 1

## 2020-12-28 MED ORDER — OXYTOCIN-SODIUM CHLORIDE 30-0.9 UT/500ML-% IV SOLN: 2.5000 [IU]/h | INTRAVENOUS | Status: DC

## 2020-12-28 MED ORDER — LABETALOL HCL 5 MG/ML IV SOLN: 20.0000 mg | INTRAVENOUS | Status: DC | PRN

## 2020-12-28 MED ORDER — PHENYLEPHRINE HCL (PRESSORS) 10 MG/ML IV SOLN
INTRAVENOUS | Status: DC | PRN
  Administered 2020-12-28: 80 ug via INTRAVENOUS
  Administered 2020-12-28: 120 ug via INTRAVENOUS
  Administered 2020-12-28 (×2): 80 ug via INTRAVENOUS

## 2020-12-28 MED ORDER — ONDANSETRON HCL 4 MG/2ML IJ SOLN
INTRAMUSCULAR | Status: DC | PRN
  Administered 2020-12-28: 4 mg via INTRAVENOUS

## 2020-12-28 MED ORDER — NALOXONE HCL 0.4 MG/ML IJ SOLN: 0.4000 mg | INTRAMUSCULAR | Status: DC | PRN

## 2020-12-28 MED ORDER — EPHEDRINE 5 MG/ML INJ
INTRAVENOUS | Status: AC
  Filled 2020-12-28: qty 10

## 2020-12-28 MED ORDER — NALBUPHINE HCL 10 MG/ML IJ SOLN: 5.0000 mg | Freq: Once | INTRAMUSCULAR | Status: DC | PRN

## 2020-12-28 MED ORDER — CEFAZOLIN SODIUM-DEXTROSE 2-4 GM/100ML-% IV SOLN
INTRAVENOUS | Status: AC
  Filled 2020-12-28: qty 100

## 2020-12-28 MED ORDER — LIDOCAINE HCL (PF) 2 % IJ SOLN
INTRAMUSCULAR | Status: DC | PRN
  Administered 2020-12-28 (×3): 100 mg via EPIDURAL

## 2020-12-28 MED ORDER — LABETALOL HCL 5 MG/ML IV SOLN: 40.0000 mg | INTRAVENOUS | Status: DC | PRN

## 2020-12-28 MED ORDER — SODIUM CHLORIDE 0.9 % IR SOLN
Status: DC | PRN
  Administered 2020-12-28: 1000 mL

## 2020-12-28 MED ORDER — OXYTOCIN-SODIUM CHLORIDE 30-0.9 UT/500ML-% IV SOLN
1.0000 m[IU]/min | INTRAVENOUS | Status: DC
  Administered 2020-12-28: 2 m[IU]/min via INTRAVENOUS

## 2020-12-28 MED ORDER — LACTATED RINGERS IV SOLN
500.0000 mL | Freq: Once | INTRAVENOUS | Status: AC
  Administered 2020-12-28: 500 mL via INTRAVENOUS

## 2020-12-28 MED ORDER — HYDROMORPHONE HCL 1 MG/ML IJ SOLN: 0.2500 mg | INTRAMUSCULAR | Status: DC | PRN

## 2020-12-28 MED ORDER — EPHEDRINE SULFATE 50 MG/ML IJ SOLN
INTRAMUSCULAR | Status: DC | PRN
  Administered 2020-12-28 (×2): 5 mg via INTRAVENOUS

## 2020-12-28 MED ORDER — ONDANSETRON HCL 4 MG/2ML IJ SOLN
4.0000 mg | Freq: Four times a day (QID) | INTRAMUSCULAR | Status: DC | PRN
  Administered 2020-12-28: 4 mg via INTRAVENOUS
  Filled 2020-12-28: qty 2

## 2020-12-28 MED ORDER — MORPHINE SULFATE (PF) 0.5 MG/ML IJ SOLN
INTRAMUSCULAR | Status: AC
  Filled 2020-12-28: qty 10

## 2020-12-28 MED ORDER — FENTANYL CITRATE (PF) 100 MCG/2ML IJ SOLN
INTRAMUSCULAR | Status: DC | PRN
  Administered 2020-12-28 (×2): 50 ug via INTRAVENOUS

## 2020-12-28 MED ORDER — TRANEXAMIC ACID-NACL 1000-0.7 MG/100ML-% IV SOLN
INTRAVENOUS | Status: AC
  Filled 2020-12-28: qty 100

## 2020-12-28 MED ORDER — CEFAZOLIN SODIUM-DEXTROSE 2-3 GM-%(50ML) IV SOLR
INTRAVENOUS | Status: DC | PRN
  Administered 2020-12-28: 2 g via INTRAVENOUS

## 2020-12-28 MED ORDER — PHENYLEPHRINE 40 MCG/ML (10ML) SYRINGE FOR IV PUSH (FOR BLOOD PRESSURE SUPPORT): 80.0000 ug | PREFILLED_SYRINGE | INTRAVENOUS | Status: DC | PRN

## 2020-12-28 MED ORDER — ACETAMINOPHEN 325 MG PO TABS: 650.0000 mg | ORAL_TABLET | ORAL | Status: DC | PRN

## 2020-12-28 MED ORDER — SODIUM CHLORIDE 0.9 % IR SOLN
Status: DC | PRN
  Administered 2020-12-28: 500 mL

## 2020-12-28 MED ORDER — FENTANYL CITRATE (PF) 100 MCG/2ML IJ SOLN: 100.0000 ug | INTRAMUSCULAR | Status: DC | PRN

## 2020-12-28 MED ORDER — PHENYLEPHRINE 40 MCG/ML (10ML) SYRINGE FOR IV PUSH (FOR BLOOD PRESSURE SUPPORT)
80.0000 ug | PREFILLED_SYRINGE | INTRAVENOUS | Status: DC | PRN
  Filled 2020-12-28: qty 10

## 2020-12-28 MED ORDER — KETOROLAC TROMETHAMINE 30 MG/ML IJ SOLN
30.0000 mg | Freq: Once | INTRAMUSCULAR | Status: AC
  Administered 2020-12-29: 30 mg via INTRAVENOUS

## 2020-12-28 MED ORDER — LIDOCAINE HCL (PF) 2 % IJ SOLN
INTRAMUSCULAR | Status: AC
  Filled 2020-12-28: qty 15

## 2020-12-28 MED ORDER — SOD CITRATE-CITRIC ACID 500-334 MG/5ML PO SOLN: 30.0000 mL | ORAL | Status: DC | PRN

## 2020-12-28 MED ORDER — OXYTOCIN-SODIUM CHLORIDE 30-0.9 UT/500ML-% IV SOLN
1.0000 m[IU]/min | INTRAVENOUS | Status: DC
  Filled 2020-12-28: qty 500

## 2020-12-28 MED ORDER — EPHEDRINE 5 MG/ML INJ: 10.0000 mg | INTRAVENOUS | Status: DC | PRN

## 2020-12-28 MED ORDER — MISOPROSTOL 25 MCG QUARTER TABLET
25.0000 ug | ORAL_TABLET | ORAL | Status: DC | PRN
  Filled 2020-12-28: qty 1

## 2020-12-28 MED ORDER — OXYTOCIN-SODIUM CHLORIDE 30-0.9 UT/500ML-% IV SOLN
INTRAVENOUS | Status: DC | PRN
  Administered 2020-12-28: 300 mL via INTRAVENOUS
  Administered 2020-12-28: 200 mL via INTRAVENOUS

## 2020-12-28 MED ORDER — LACTATED RINGERS IV SOLN
500.0000 mL | INTRAVENOUS | Status: DC | PRN
  Administered 2020-12-28: 500 mL via INTRAVENOUS

## 2020-12-28 MED ORDER — LIDOCAINE HCL (PF) 1 % IJ SOLN: 30.0000 mL | INTRAMUSCULAR | Status: DC | PRN

## 2020-12-28 MED ORDER — SCOPOLAMINE 1 MG/3DAYS TD PT72
MEDICATED_PATCH | TRANSDERMAL | Status: AC
  Filled 2020-12-28: qty 1

## 2020-12-28 MED ORDER — PROMETHAZINE HCL 25 MG/ML IJ SOLN
6.2500 mg | INTRAMUSCULAR | Status: DC | PRN
Start: 2020-12-28 — End: 2020-12-29

## 2020-12-28 MED ORDER — OXYTOCIN-SODIUM CHLORIDE 30-0.9 UT/500ML-% IV SOLN
INTRAVENOUS | Status: AC
  Filled 2020-12-28: qty 500

## 2020-12-28 MED ORDER — LACTATED RINGERS IV SOLN: INTRAVENOUS | Status: DC | PRN

## 2020-12-28 MED ORDER — DIPHENHYDRAMINE HCL 50 MG/ML IJ SOLN: 12.5000 mg | INTRAMUSCULAR | Status: DC | PRN

## 2020-12-28 MED ORDER — MAGNESIUM SULFATE BOLUS VIA INFUSION
4.0000 g | Freq: Once | INTRAVENOUS | Status: AC
  Administered 2020-12-28: 4 g via INTRAVENOUS
  Filled 2020-12-28: qty 1000

## 2020-12-28 MED ORDER — TERBUTALINE SULFATE 1 MG/ML IJ SOLN: 0.2500 mg | Freq: Once | INTRAMUSCULAR | Status: DC | PRN

## 2020-12-28 MED ORDER — PENICILLIN G POT IN DEXTROSE 60000 UNIT/ML IV SOLN
3.0000 10*6.[IU] | INTRAVENOUS | Status: DC
  Administered 2020-12-28 (×3): 3 10*6.[IU] via INTRAVENOUS
  Filled 2020-12-28 (×4): qty 50

## 2020-12-28 MED ORDER — KETOROLAC TROMETHAMINE 30 MG/ML IJ SOLN: 30.0000 mg | Freq: Once | INTRAMUSCULAR | Status: AC | PRN

## 2020-12-28 MED ORDER — SODIUM CHLORIDE 0.9% FLUSH: 3.0000 mL | INTRAVENOUS | Status: DC | PRN

## 2020-12-28 MED ORDER — MORPHINE SULFATE (PF) 0.5 MG/ML IJ SOLN
INTRAMUSCULAR | Status: DC | PRN
  Administered 2020-12-28: 3 mg via EPIDURAL

## 2020-12-28 MED ORDER — MAGNESIUM SULFATE 40 GM/1000ML IV SOLN
2.0000 g/h | INTRAVENOUS | Status: DC
  Administered 2020-12-28: 2 g/h via INTRAVENOUS
  Filled 2020-12-28: qty 1000

## 2020-12-28 MED ORDER — FENTANYL CITRATE (PF) 100 MCG/2ML IJ SOLN
INTRAMUSCULAR | Status: AC
  Filled 2020-12-28: qty 2

## 2020-12-28 MED ORDER — OXYCODONE-ACETAMINOPHEN 5-325 MG PO TABS: 2.0000 | ORAL_TABLET | ORAL | Status: DC | PRN

## 2020-12-28 MED ORDER — OXYCODONE-ACETAMINOPHEN 5-325 MG PO TABS: 1.0000 | ORAL_TABLET | ORAL | Status: DC | PRN

## 2020-12-28 MED ORDER — MISOPROSTOL 50MCG HALF TABLET
ORAL_TABLET | ORAL | Status: AC
  Administered 2020-12-28: 50 ug via BUCCAL
  Filled 2020-12-28: qty 1

## 2020-12-28 MED ORDER — LIDOCAINE-EPINEPHRINE (PF) 2 %-1:200000 IJ SOLN
INTRAMUSCULAR | Status: DC | PRN
  Administered 2020-12-28: 4 mL via EPIDURAL

## 2020-12-28 SURGICAL SUPPLY — 34 items
BENZOIN TINCTURE PRP APPL 2/3 (GAUZE/BANDAGES/DRESSINGS) ×2 IMPLANT
CHLORAPREP W/TINT 26ML (MISCELLANEOUS) ×2 IMPLANT
CLAMP CORD UMBIL (MISCELLANEOUS) IMPLANT
CLOTH BEACON ORANGE TIMEOUT ST (SAFETY) ×2 IMPLANT
CLSR STERI-STRIP ANTIMIC 1/2X4 (GAUZE/BANDAGES/DRESSINGS) ×2 IMPLANT
DRSG OPSITE POSTOP 4X10 (GAUZE/BANDAGES/DRESSINGS) ×2 IMPLANT
ELECT REM PT RETURN 9FT ADLT (ELECTROSURGICAL) ×2
ELECTRODE REM PT RTRN 9FT ADLT (ELECTROSURGICAL) ×1 IMPLANT
EXTRACTOR VACUUM M CUP 4 TUBE (SUCTIONS) IMPLANT
GLOVE BIOGEL PI IND STRL 7.0 (GLOVE) ×3 IMPLANT
GLOVE BIOGEL PI INDICATOR 7.0 (GLOVE) ×3
GLOVE ECLIPSE 7.0 STRL STRAW (GLOVE) ×2 IMPLANT
GOWN STRL REUS W/TWL LRG LVL3 (GOWN DISPOSABLE) ×4 IMPLANT
KIT ABG SYR 3ML LUER SLIP (SYRINGE) IMPLANT
NEEDLE HYPO 22GX1.5 SAFETY (NEEDLE) ×2 IMPLANT
NEEDLE HYPO 25X5/8 SAFETYGLIDE (NEEDLE) ×2 IMPLANT
NS IRRIG 1000ML POUR BTL (IV SOLUTION) ×2 IMPLANT
PACK C SECTION WH (CUSTOM PROCEDURE TRAY) ×2 IMPLANT
PAD ABD 7.5X8 STRL (GAUZE/BANDAGES/DRESSINGS) ×2 IMPLANT
PAD ABD 8X10 STRL (GAUZE/BANDAGES/DRESSINGS) ×2 IMPLANT
PAD OB MATERNITY 4.3X12.25 (PERSONAL CARE ITEMS) ×2 IMPLANT
PENCIL SMOKE EVAC W/HOLSTER (ELECTROSURGICAL) ×2 IMPLANT
RTRCTR C-SECT PINK 25CM LRG (MISCELLANEOUS) IMPLANT
SPONGE GAUZE 4X4 12PLY STER LF (GAUZE/BANDAGES/DRESSINGS) ×4 IMPLANT
SUT PDS AB 0 CTX 36 PDP370T (SUTURE) IMPLANT
SUT PLAIN 2 0 XLH (SUTURE) IMPLANT
SUT VIC AB 0 CTX 36 (SUTURE) ×2
SUT VIC AB 0 CTX36XBRD ANBCTRL (SUTURE) ×2 IMPLANT
SUT VIC AB 4-0 KS 27 (SUTURE) ×2 IMPLANT
SYR CONTROL 10ML LL (SYRINGE) ×2 IMPLANT
TAPE MEDIFIX FOAM 3 (GAUZE/BANDAGES/DRESSINGS) ×2 IMPLANT
TOWEL OR 17X24 6PK STRL BLUE (TOWEL DISPOSABLE) ×2 IMPLANT
TRAY FOLEY W/BAG SLVR 14FR LF (SET/KITS/TRAYS/PACK) ×2 IMPLANT
WATER STERILE IRR 1000ML POUR (IV SOLUTION) ×2 IMPLANT
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
## 2020-12-28 NOTE — Progress Notes (Addendum)
    Faculty Practice OB/GYN Attending Note  Called to evaluate patient with ongoing prolonged FHR deceleration for several minutes. FHR in 60-80s, exam 10/100/+2.   Given pronged FHR deceleration, patient was verbally consented for vacuum assistance, OR notified of possible need for stat cesarean delivery. No known fetal contraindications to operative vaginal delivery.   The MityVac bell soft vacuum cup was positioned over the sagittal suture 3 cm anterior to posterior fontanelle.  Pressure was then increased and the patient was instructed to push.  Pulling was administered along the pelvic curve while patient was pushing; there were 3 contractions and 3 popoffs, no further descent noted.  Code Cesarean activated. FHR noted to be in the 70s on the way to OR.    Patient was verbally consented for cesarean delivery on the way to OR. See operative note for further details.   Jaynie Collins, MD, FACOG Obstetrician & Gynecologist, [facility] for [employer], [facility]
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
## 2020-12-28 NOTE — Anesthesia Preprocedure Evaluation (Signed)
Anesthesia Evaluation  Patient identified by MRN, date of birth, ID band Patient awake    Reviewed: Allergy & Precautions, NPO status , Patient's Chart, lab work & pertinent test results  Airway Mallampati: II  TM Distance: >3 FB Neck ROM: Full    Dental no notable dental hx.    Pulmonary neg pulmonary ROS,    Pulmonary exam normal breath sounds clear to auscultation       Cardiovascular hypertension (preE on mag), Normal cardiovascular exam Rhythm:Regular Rate:Normal     Neuro/Psych PSYCHIATRIC DISORDERS Anxiety Depression negative neurological ROS     GI/Hepatic negative GI ROS, Neg liver ROS,   Endo/Other  negative endocrine ROS  Renal/GU negative Renal ROS  negative genitourinary   Musculoskeletal negative musculoskeletal ROS (+)   Abdominal   Peds  Hematology negative hematology ROS (+)   Anesthesia Other Findings IOL for preE  Reproductive/Obstetrics (+) Pregnancy                             Anesthesia Physical Anesthesia Plan  ASA: III  Anesthesia Plan: Epidural   Post-op Pain Management:    Induction:   PONV Risk Score and Plan: Treatment may vary due to age or medical condition  Airway Management Planned: Natural Airway  Additional Equipment:   Intra-op Plan:   Post-operative Plan:   Informed Consent: I have reviewed the patients History and Physical, chart, labs and discussed the procedure including the risks, benefits and alternatives for the proposed anesthesia with the patient or authorized representative who has indicated his/her understanding and acceptance.       Plan Discussed with: Anesthesiologist  Anesthesia Plan Comments: (Patient identified. Risks, benefits, options discussed with patient including but not limited to bleeding, infection, nerve damage, paralysis, failed block, incomplete pain control, headache, blood pressure changes, nausea,  vomiting, reactions to medication, itching, and post partum back pain. Confirmed with bedside nurse the patient's most recent platelet count. Confirmed with the patient that they are not taking any anticoagulation, have any bleeding history or any family history of bleeding disorders. Patient expressed understanding and wishes to proceed. All questions were answered. )        Anesthesia Quick Evaluation
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
## 2020-12-28 NOTE — MAU Note (Signed)
Patient presents to MAU from [facility] complaining of right rib cage pain. Denies vaginal bleeding or ctx. +FM

## 2020-12-28 NOTE — Op Note (Signed)
Casey West PROCEDURE DATE: 12/28/2020  PREOPERATIVE DIAGNOSES: Intrauterine pregnancy at [redacted]w[redacted]d weeks gestation; non-reassuring fetal status; severe preeclampsia  POSTOPERATIVE DIAGNOSES: The same  PROCEDURE: STAT Primary  Low Transverse Cesarean Section  SURGEON:  Dr. Jaynie Collins  ASSISTANT:  Dr. Lynnda Shields  ANESTHESIOLOGY TEAM: Anesthesiologist: Leilani Able, MD; Elmer Picker, MD CRNA: Rhymer, Doree Fudge, CRNA  INDICATIONS: Casey West is a 30 y.o. [phone] at [redacted]w[redacted]d here for stat cesarean section secondary to the indications listed under preoperative diagnoses; please see preoperative note for further details.  Due to emergency situation, a full written consent process was not obtained. The risks, benefits, complications, treatment options, and expected outcomes were discussed with the patient while moving to the OR.   The patient concurred with the proposed plan, giving verbal informed consent.   FINDINGS:  Viable female infant in cephalic presentation. Tight nuchal cord.  Apgars 4 and 8, arterial cord pH 7.02. Neonate remained in OR with patient after initial resuscitation by Neonatology team.   Clear amniotic fluid.  Intact placenta, three vessel cord.  Normal uterus, fallopian tubes and ovaries bilaterally.  ANESTHESIA: Epidural  ESTIMATED BLOOD LOSS: 400 ml SPECIMENS: Placenta sent to pathology COMPLICATIONS: None immediate  PROCEDURE IN DETAIL:  The patient was urgently taken to the the operating room her epidural anesthesia was dosed up to surgical level and was found to be adequate. She was then placed in a dorsal supine position with a leftward tilt, prepped quickly with betadine and draped in a sterile manner.  She already had a foley catheter in her bladder from L&D.  After a timeout was performed, a Pfannenstiel skin incision was made with scalpel and carried through to the underlying layer of fascia. The fascial incision was extended bilaterally in a blunt  fashion.  The fascia was separated from underlying rectus muscles bluntly.  The rectus muscles were separated in the midline bluntly and the peritoneum was entered bluntly. Attention was turned to the lower uterine segment where a low transverse hysterotomy was made with a scalpel and extended bilaterally bluntly.  The infant was successfully delivered, the cord was clamped and cut and the infant was handed over to awaiting neonatology team.  Incision to delivery time was about one minute.  The placenta was delivered intact and had a three-vessel cord. The uterus was then cleared of clot and debris.  The hysterotomy was closed with 0 Vicryl in a running locked fashion, and an imbricating layer was also placed with 0 Vicryl. Figure-of-eight 0 Vicryl serosal stitches were placed to help with hemostasis.  The pelvis was cleared of all clot and debris. Hemostasis was confirmed on all surfaces.  The retractor was removed.  The peritoneum was closed with a 0 Vicryl running stitch and the rectus muscles were reapproximated using 0 Vicryl interrupted stitches. The fascia was then closed using 0 Vicryl in a running fashion.  The subcutaneous layer was irrigated, reapproximated with 2-0 plain gut interrupted stitches, and the skin was closed with a 4-0 Vicryl subcuticular stitch. The patient tolerated the procedure well. Sponge, instrument and needle counts were correct x 3.  She was taken to the recovery room in stable condition.    Jaynie Collins, MD, FACOG Obstetrician & Gynecologist, [facility] for [employer], [facility]
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
## 2020-12-28 NOTE — Transfer of Care (Signed)
Immediate Anesthesia Transfer of Care Note  Patient: Casey West  Procedure(s) Performed: CESAREAN SECTION (N/A )  Patient Location: PACU  Anesthesia Type:Epidural  Level of Consciousness: awake, alert  and oriented  Airway & Oxygen Therapy: Patient Spontanous Breathing  Post-op Assessment: Report given to RN and Post -op Vital signs reviewed and stable  Post vital signs: Reviewed and stable BP 104/77  Last Vitals:  Vitals Value Taken Time  BP 80/60 12/28/20 2300  Temp    Pulse 77 12/28/20 2302  Resp 10 12/28/20 2302  SpO2 98 % 12/28/20 2302  Vitals shown include unvalidated device data.  Last Pain:  Vitals:   12/28/20 2000  TempSrc:   PainSc: 3          Complications: No complications documented.

## 2020-12-28 NOTE — Anesthesia Procedure Notes (Signed)
Epidural Patient location during procedure: OB Start time: 12/28/2020 12:50 PM End time: 12/28/2020 1:00 PM  Staffing Anesthesiologist: Elmer Picker, MD Performed: anesthesiologist   Preanesthetic Checklist Completed: patient identified, IV checked, risks and benefits discussed, monitors and equipment checked, pre-op evaluation and timeout performed  Epidural Patient position: sitting Prep: DuraPrep and site prepped and draped Patient monitoring: continuous pulse ox, blood pressure, heart rate and cardiac monitor Approach: midline Location: L3-L4 Injection technique: LOR air  Needle:  Needle type: Tuohy  Needle gauge: 17 G Needle length: 9 cm Needle insertion depth: 5 cm Catheter type: closed end flexible Catheter size: 19 Gauge Catheter at skin depth: 10 cm Test dose: negative  Assessment Sensory level: T8 Events: blood not aspirated, injection not painful, no injection resistance, no paresthesia and negative IV test  Additional Notes Patient identified. Risks/Benefits/Options discussed with patient including but not limited to bleeding, infection, nerve damage, paralysis, failed block, incomplete pain control, headache, blood pressure changes, nausea, vomiting, reactions to medication both or allergic, itching and postpartum back pain. Confirmed with bedside nurse the patient's most recent platelet count. Confirmed with patient that they are not currently taking any anticoagulation, have any bleeding history or any family history of bleeding disorders. Patient expressed understanding and wished to proceed. All questions were answered. Sterile technique was used throughout the entire procedure. Please see nursing notes for vital signs. Test dose was given through epidural catheter and negative prior to continuing to dose epidural or start infusion. Warning signs of high block given to the patient including shortness of breath, tingling/numbness in hands, complete motor block,  or any concerning symptoms with instructions to call for help. Patient was given instructions on fall risk and not to get out of bed. All questions and concerns addressed with instructions to call with any issues or inadequate analgesia.  Reason for block:procedure for pain

## 2020-12-28 NOTE — Progress Notes (Signed)
Labor Progress Note Casey West is a 30 y.o. [phone] at [redacted]w[redacted]d presented for IOL for PEC w/SF  S:  Comfortable with epidural.  O:  BP 119/74   Pulse (!) 49   Temp (!) 97.4 F (36.3 C) (Oral)   Resp 18   Wt 80.8 kg   LMP  (LMP Unknown)   SpO2 99%   BMI 32.57 kg/m  EFM: baseline 115 bpm/ mod variability/ + accels/ no decels  Toco/IUPC: q6-7 SVE: Dilation: 6 Effacement (%): 70 Station: -2 Presentation: Vertex Exam by:: Fabian November  A/P: 30 y.o. [id number] [redacted]w[redacted]d  1. Labor: latent 2. FWB: Cat I 3. Pain: epidural 4. PEC: stable, creatinine improved, currently normotensive 5. GBS pos>PCN  No cervical change since AROM, recommend Pitocin augmentation, pt agrees. Anticipate labor progression and SVD.  Donette Larry, CNM 4:22 PM
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
## 2020-12-29 ENCOUNTER — Encounter (HOSPITAL_COMMUNITY): Payer: Self-pay | Admitting: Obstetrics and Gynecology

## 2020-12-29 LAB — COMPREHENSIVE METABOLIC PANEL
ALT: 16 U/L (ref 0–44)
AST: 24 U/L (ref 15–41)
Albumin: 2.2 g/dL — ABNORMAL LOW (ref 3.5–5.0)
Alkaline Phosphatase: 128 U/L — ABNORMAL HIGH (ref 38–126)
Anion gap: 11 (ref 5–15)
BUN: 15 mg/dL (ref 6–20)
CO2: 18 mmol/L — ABNORMAL LOW (ref 22–32)
Calcium: 7.1 mg/dL — ABNORMAL LOW (ref 8.9–10.3)
Chloride: 105 mmol/L (ref 98–111)
Creatinine, Ser: 1.03 mg/dL — ABNORMAL HIGH (ref 0.44–1.00)
GFR, Estimated: >60 mL/min (ref >60)
Glucose, Bld: 151 mg/dL — ABNORMAL HIGH (ref 70–99)
Potassium: 4.2 mmol/L (ref 3.5–5.1)
Sodium: 134 mmol/L — ABNORMAL LOW (ref 135–145)
Total Bilirubin: 0.4 mg/dL (ref 0.3–1.2)
Total Protein: 4.8 g/dL — ABNORMAL LOW (ref 6.5–8.1)

## 2020-12-29 LAB — GC/CHLAMYDIA PROBE AMP (~~LOC~~) NOT AT ARMC
Chlamydia: NEGATIVE
Comment: NEGATIVE
Comment: NORMAL
Neisseria Gonorrhea: NEGATIVE

## 2020-12-29 LAB — MAGNESIUM: Magnesium: 6.9 mg/dL (ref 1.7–2.4)

## 2020-12-29 LAB — CBC
HCT: 30.3 % — ABNORMAL LOW (ref 36.0–46.0)
Hemoglobin: 11 g/dL — ABNORMAL LOW (ref 12.0–15.0)
MCH: 30.9 pg (ref 26.0–34.0)
MCHC: 36.3 g/dL — ABNORMAL HIGH (ref 30.0–36.0)
MCV: 85.1 fL (ref 80.0–100.0)
Platelets: 222 10*3/uL (ref 150–400)
RBC: 3.56 MIL/uL — ABNORMAL LOW (ref 3.87–5.11)
RDW: 12.8 % (ref 11.5–15.5)
WBC: 22.3 10*3/uL — ABNORMAL HIGH (ref 4.0–10.5)
nRBC: 0 % (ref 0.0–0.2)

## 2020-12-29 MED ORDER — ZOLPIDEM TARTRATE 5 MG PO TABS: 5.0000 mg | ORAL_TABLET | Freq: Every evening | ORAL | Status: DC | PRN

## 2020-12-29 MED ORDER — MENTHOL 3 MG MT LOZG
1.0000 | LOZENGE | OROMUCOSAL | Status: DC | PRN
  Filled 2020-12-29: qty 9

## 2020-12-29 MED ORDER — DIPHENHYDRAMINE HCL 25 MG PO CAPS: 25.0000 mg | ORAL_CAPSULE | Freq: Four times a day (QID) | ORAL | Status: DC | PRN

## 2020-12-29 MED ORDER — MAGNESIUM HYDROXIDE 400 MG/5ML PO SUSP
30.0000 mL | ORAL | Status: DC | PRN
  Administered 2020-12-30: 30 mL via ORAL
  Filled 2020-12-29: qty 30

## 2020-12-29 MED ORDER — MEASLES, MUMPS & RUBELLA VAC IJ SOLR: 0.5000 mL | Freq: Once | INTRAMUSCULAR | Status: DC

## 2020-12-29 MED ORDER — DIBUCAINE (PERIANAL) 1 % EX OINT: 1.0000 "application " | TOPICAL_OINTMENT | CUTANEOUS | Status: DC | PRN

## 2020-12-29 MED ORDER — TETANUS-DIPHTH-ACELL PERTUSSIS 5-2.5-18.5 LF-MCG/0.5 IM SUSY: 0.5000 mL | PREFILLED_SYRINGE | Freq: Once | INTRAMUSCULAR | Status: DC

## 2020-12-29 MED ORDER — ACETAMINOPHEN 10 MG/ML IV SOLN
1000.0000 mg | Freq: Four times a day (QID) | INTRAVENOUS | Status: AC
Start: 2020-12-29 — End: 2020-12-30
  Administered 2020-12-29 – 2020-12-30 (×4): 1000 mg via INTRAVENOUS
  Filled 2020-12-29 (×6): qty 100

## 2020-12-29 MED ORDER — SIMETHICONE 80 MG PO CHEW: 80.0000 mg | CHEWABLE_TABLET | ORAL | Status: DC | PRN

## 2020-12-29 MED ORDER — LACTATED RINGERS IV SOLN: INTRAVENOUS | Status: DC

## 2020-12-29 MED ORDER — OXYCODONE HCL 5 MG PO TABS
5.0000 mg | ORAL_TABLET | ORAL | Status: DC | PRN
Start: 2020-12-29 — End: 2020-12-31
  Administered 2020-12-30 – 2020-12-31 (×5): 5 mg via ORAL
  Filled 2020-12-29 (×5): qty 1

## 2020-12-29 MED ORDER — ENOXAPARIN SODIUM 40 MG/0.4ML ~~LOC~~ SOLN
40.0000 mg | SUBCUTANEOUS | Status: DC
  Administered 2020-12-30 (×2): 40 mg via SUBCUTANEOUS
  Filled 2020-12-29 (×2): qty 0.4

## 2020-12-29 MED ORDER — KETOROLAC TROMETHAMINE 30 MG/ML IJ SOLN: 30.0000 mg | Freq: Four times a day (QID) | INTRAMUSCULAR | Status: DC

## 2020-12-29 MED ORDER — ONDANSETRON HCL 4 MG/2ML IJ SOLN
4.0000 mg | Freq: Four times a day (QID) | INTRAMUSCULAR | Status: DC | PRN
  Administered 2020-12-29: 4 mg via INTRAVENOUS
  Filled 2020-12-29: qty 2

## 2020-12-29 MED ORDER — GABAPENTIN 600 MG PO TABS
300.0000 mg | ORAL_TABLET | Freq: Two times a day (BID) | ORAL | Status: DC
  Administered 2020-12-29 – 2020-12-30 (×4): 300 mg via ORAL
  Filled 2020-12-29 (×6): qty 0.5

## 2020-12-29 MED ORDER — OXYTOCIN-SODIUM CHLORIDE 30-0.9 UT/500ML-% IV SOLN: 2.5000 [IU]/h | INTRAVENOUS | Status: AC

## 2020-12-29 MED ORDER — GABAPENTIN 300 MG PO CAPS: 300.0000 mg | ORAL_CAPSULE | Freq: Two times a day (BID) | ORAL | Status: DC

## 2020-12-29 MED ORDER — WITCH HAZEL-GLYCERIN EX PADS
1.0000 | MEDICATED_PAD | CUTANEOUS | Status: DC | PRN
Start: 2020-12-28 — End: 2020-12-31

## 2020-12-29 MED ORDER — OXYCODONE-ACETAMINOPHEN 5-325 MG PO TABS: 2.0000 | ORAL_TABLET | ORAL | Status: DC | PRN

## 2020-12-29 MED ORDER — COCONUT OIL OIL: 1.0000 "application " | TOPICAL_OIL | Status: DC | PRN

## 2020-12-29 MED ORDER — MAGNESIUM SULFATE 40 GM/1000ML IV SOLN
1.0000 g/h | INTRAVENOUS | Status: AC
  Administered 2020-12-29: 2 g/h via INTRAVENOUS
  Filled 2020-12-29: qty 1000

## 2020-12-29 MED ORDER — IBUPROFEN 800 MG PO TABS: 800.0000 mg | ORAL_TABLET | Freq: Four times a day (QID) | ORAL | Status: DC

## 2020-12-29 MED ORDER — HYDROMORPHONE HCL 1 MG/ML IJ SOLN: 1.0000 mg | INTRAMUSCULAR | Status: DC | PRN

## 2020-12-29 MED ORDER — TRAMADOL HCL 50 MG PO TABS
50.0000 mg | ORAL_TABLET | Freq: Four times a day (QID) | ORAL | Status: DC | PRN
Start: 2020-12-29 — End: 2020-12-29

## 2020-12-29 MED ORDER — PRENATAL MULTIVITAMIN CH: 1.0000 | ORAL_TABLET | Freq: Every day | ORAL | Status: DC

## 2020-12-29 MED ORDER — SENNOSIDES-DOCUSATE SODIUM 8.6-50 MG PO TABS: 2.0000 | ORAL_TABLET | Freq: Every day | ORAL | Status: DC

## 2020-12-29 MED ORDER — FERROUS SULFATE 325 (65 FE) MG PO TABS: 325.0000 mg | ORAL_TABLET | Freq: Two times a day (BID) | ORAL | Status: DC

## 2020-12-29 MED ORDER — NIFEDIPINE ER OSMOTIC RELEASE 30 MG PO TB24: 30.0000 mg | ORAL_TABLET | Freq: Every day | ORAL | Status: DC

## 2020-12-29 MED ORDER — DIPHENHYDRAMINE HCL 50 MG/ML IJ SOLN
12.5000 mg | Freq: Four times a day (QID) | INTRAMUSCULAR | Status: DC | PRN
  Administered 2020-12-29: 12.5 mg via INTRAVENOUS
  Filled 2020-12-29: qty 1
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
## 2020-12-29 NOTE — Addendum Note (Signed)
Addendum  created 12/29/20 0810 by Renford Dills, CRNA   Clinical Note Signed

## 2020-12-29 NOTE — Lactation Note (Signed)
This note was copied from a baby's chart. Lactation Consultation Note  Patient Name: Casey West RCBUL'A Date: 12/29/2020 Reason for consult: Initial assessment;Infant < 6lbs;Early term 37-38.6wks Age:32 hours LC to room for initial consult. Mom on mag and feeling unwell. She bf earlier and is supplementing with formula. She reports that sitting up in bed causes n&v. She declines to pump at this time. LC provided mom with written information on supplementing preterm infant. Mom in agreement with recommendations.   Maternal Data Has patient been taught Hand Expression?: Yes Does the patient have breastfeeding experience prior to this delivery?: Yes How long did the patient breastfeed?: 3 mo  Feeding Mother's Current Feeding Choice: Breast Milk and Formula  Interventions Interventions: Breast feeding basics reviewed;Education;Expressed milk;DEBP LC provided written brochure on LC services. Mom aware of inpatient services and will request assistance when she is feeling better. Discharge Pump: Personal Arta Bruce)  Consult Status Consult Status: Follow-up Follow-up type: In-patient  Elder Negus, MA IBCLC 12/29/2020, 11:19 AM

## 2020-12-29 NOTE — Progress Notes (Signed)
Postpartum Day 1: Cesarean Delivery at 42w6dfor fetal bradycardia in the setting of severe PEC.  Subjective: Feels nauseated.  Patient denies any headaches, visual symptoms, RUQ/epigastric pain or other concerning symptoms. Baby is in Nursery.  Patient is very anxious about the way she feels on magnesium.  Wants to breastfeed but feels that Magnesium makes her feel weird and she cannot.   Objective: Vital signs in last 24 hours: Temp:  [97.4 F (36.3 C)-97.7 F (36.5 C)] 97.5 F (36.4 C) (02/28 0050) Pulse Rate:  [49-157] 69 (02/28 0500) Resp:  [11-29] 16 (02/28 0500) BP: (80-137)/(60-97) 100/63 (02/28 0500) SpO2:  [95 %-98 %] 95 % (02/28 0500)  Physical Exam:  General: alert and no distress Lochia: appropriate Uterine Fundus: firm, NT Incision: pressure dressing in place DVT Evaluation: No evidence of DVT seen on physical exam. Negative Homan's sign. No significant calf/ankle edema.  Labs: CMP Latest Ref Rng & Units 12/29/2020 12/28/2020 12/28/2020  Glucose 70 - 99 mg/dL 151(H) 125(H) 90  BUN 6 - 20 mg/dL _0 Creatinine 0.44 - 1.00 mg/dL 1.03(H) 0.85 1.06(H)  Sodium 135 - 145 mmol/L 134(L) 135 134(L)  Potassium 3.5 - 5.1 mmol/L 4.2 4.1 4.1  Chloride 98 - 111 mmol/L 105 108 109  CO2 22 - 32 mmol/L 18(L) 17(L) 18(L)  Calcium 8.9 - 10.3 mg/dL 7.1(L) 8.3(L) 8.4(L)  Total Protein 6.5 - 8.1 g/dL 4.8(L) 5.4(L) 5.4(L)  Total Bilirubin 0.3 - 1.2 mg/dL 0.4 0.6 0.5  Alkaline Phos 38 - 126 U/L 128(H) 124 134(H)  AST 15 - 41 U/L _1 ALT 0 - 44 U/L _2 CBC Latest Ref Rng & Units 12/29/2020 12/28/2020 12/28/2020  WBC 4.0 - 10.5 K/uL 22.3(H) 22.3(H) 13.0(H)  Hemoglobin 12.0 - 15.0 g/dL 11.0(L) 11.1(L) 11.6(L)  Hematocrit 36.0 - 46.0 % 30.3(L) 31.1(L) 32.1(L)  Platelets 150 - 400 K/uL 222 206 211   Magnesium level 6.9  Assessment/Plan: Status post Cesarean section, patient with severe preeclampsia # Severe PEC: Stable BP, no medications for now.  Has AKI, NSAIDs  discontinued.  Given increased Cr, magnesium sulfate is decreased to 1 g/hr until 2200.  Zofran prescribed. Continue close monitoring. Labs to be rechecked tomorrow morning. # Analgesia: IV Acetaminophen (24 hours) and Neurontin ordered as standing for now, Oxycodone as needed.   #Anxiety/Depression: Declined restarting Celexa for now, has plan with her Psychiatrist #Rubella nonimmune : MMR ordered #Breastfeeding support given #Contraception: Vasectomy Desires circumcision for her female infant, the consent for this will be done later. Procedure will be done after Pediatrician clearance, prior to baby's discharge   UVerita Schneiders MD 12/29/2020, 6:35 AM

## 2020-12-29 NOTE — Progress Notes (Signed)
CSW attempted to meet with [name] in room 103.  When CSW arrived, [name] was being attended to by RN.  CSW will return at a later time.  Blaine Hamper, MSW, LCSW Clinical Social Work [phone]

## 2020-12-29 NOTE — Discharge Instructions (Signed)

## 2020-12-29 NOTE — Anesthesia Postprocedure Evaluation (Signed)
Anesthesia Post Note  Patient: Casey West  Procedure(s) Performed: CESAREAN SECTION (N/A )     Patient location during evaluation: Mother Baby Anesthesia Type: Epidural Level of consciousness: awake and alert Pain management: pain level controlled Vital Signs Assessment: post-procedure vital signs reviewed and stable Respiratory status: spontaneous breathing, nonlabored ventilation and respiratory function stable Cardiovascular status: stable Postop Assessment: no headache, no backache and epidural receding Anesthetic complications: no   No complications documented.  Last Vitals:  Vitals:   12/29/20 0600 12/29/20 0700  BP: 106/68 104/71  Pulse: 85 67  Resp: 18 18  Temp:    SpO2: 96% 96%    Last Pain:  Vitals:   12/29/20 0642  TempSrc:   PainSc: 0-No pain   Pain Goal: Patients Stated Pain Goal: 3 (12/29/20 2956)                 Fanny Dance

## 2020-12-29 NOTE — Anesthesia Postprocedure Evaluation (Signed)
Anesthesia Post Note  Patient: Casey West  Procedure(s) Performed: CESAREAN SECTION (N/A )     Patient location during evaluation: PACU Anesthesia Type: Epidural Level of consciousness: awake Pain management: pain level controlled Vital Signs Assessment: post-procedure vital signs reviewed and stable Respiratory status: spontaneous breathing Cardiovascular status: stable Postop Assessment: no headache, no backache, epidural receding, patient able to bend at knees and no apparent nausea or vomiting Anesthetic complications: no   No complications documented.  Last Vitals:  Vitals:   12/29/20 0200 12/29/20 0300  BP: 110/73 107/73  Pulse: 80 77  Resp: 16 14  Temp:    SpO2: 97% 97%    Last Pain:  Vitals:   12/29/20 0300  TempSrc:   PainSc: (P) 0-No pain   Pain Goal:                   Caren Macadam
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
## 2020-12-30 LAB — COMPREHENSIVE METABOLIC PANEL
ALT: 15 U/L (ref 0–44)
AST: 26 U/L (ref 15–41)
Albumin: 2.1 g/dL — ABNORMAL LOW (ref 3.5–5.0)
Alkaline Phosphatase: 105 U/L (ref 38–126)
Anion gap: 11 (ref 5–15)
BUN: 18 mg/dL (ref 6–20)
CO2: 19 mmol/L — ABNORMAL LOW (ref 22–32)
Calcium: 6.9 mg/dL — ABNORMAL LOW (ref 8.9–10.3)
Chloride: 106 mmol/L (ref 98–111)
Creatinine, Ser: 1.08 mg/dL — ABNORMAL HIGH (ref 0.44–1.00)
GFR, Estimated: >60 mL/min (ref >60)
Glucose, Bld: 120 mg/dL — ABNORMAL HIGH (ref 70–99)
Potassium: 4.1 mmol/L (ref 3.5–5.1)
Sodium: 136 mmol/L (ref 135–145)
Total Bilirubin: 0.3 mg/dL (ref 0.3–1.2)
Total Protein: 5 g/dL — ABNORMAL LOW (ref 6.5–8.1)

## 2020-12-30 LAB — CBC
HCT: 24.6 % — ABNORMAL LOW (ref 36.0–46.0)
Hemoglobin: 8.8 g/dL — ABNORMAL LOW (ref 12.0–15.0)
MCH: 31.4 pg (ref 26.0–34.0)
MCHC: 35.8 g/dL (ref 30.0–36.0)
MCV: 87.9 fL (ref 80.0–100.0)
Platelets: 255 10*3/uL (ref 150–400)
RBC: 2.8 MIL/uL — ABNORMAL LOW (ref 3.87–5.11)
RDW: 13.4 % (ref 11.5–15.5)
WBC: 14 10*3/uL — ABNORMAL HIGH (ref 4.0–10.5)
nRBC: 0 % (ref 0.0–0.2)

## 2020-12-30 MED ORDER — LIDOCAINE VISCOUS HCL 2 % MT SOLN
15.0000 mL | OROMUCOSAL | Status: DC | PRN
  Administered 2020-12-30: 15 mL via OROMUCOSAL
  Filled 2020-12-30 (×2): qty 15

## 2020-12-30 MED ORDER — ACETAMINOPHEN 325 MG PO TABS
650.0000 mg | ORAL_TABLET | Freq: Four times a day (QID) | ORAL | Status: DC
  Administered 2020-12-30 – 2020-12-31 (×4): 650 mg via ORAL
  Filled 2020-12-30 (×4): qty 2

## 2020-12-30 NOTE — Plan of Care (Signed)
°  Problem: Education: Goal: Knowledge of condition will improve Outcome: Progressing   Problem: Activity: Goal: Will verbalize the importance of balancing activity with adequate rest periods Outcome: Adequate for Discharge Goal: Ability to tolerate increased activity will improve Outcome: Adequate for Discharge   Problem: Coping: Goal: Ability to identify and utilize available resources and services will improve Outcome: Adequate for Discharge   Problem: Life Cycle: Goal: Chance of risk for complications during the postpartum period will decrease Outcome: Adequate for Discharge

## 2020-12-30 NOTE — Progress Notes (Signed)
POSTPARTUM PROGRESS NOTE  PPD #2  Subjective:  Casey West is a 30 y.o. [id number] s/p LTCS at [redacted]w[redacted]d No acute events overnight. She reports she is doing well and feeling much better than yesterday. She denies any problems with ambulating, voiding or po intake. Denies nausea or vomiting. She has passed flatus, no BM. Pain is moderately controlled- she would like to take ibuprofen if possible.  Lochia is minimal.  No headache, no blurry vision, no RUQ pain.  Objective: Blood pressure 106/74, pulse 77, temperature 98.3 F (36.8 C), temperature source Oral, resp. rate 16, weight 80.8 kg, SpO2 96 %, unknown if currently breastfeeding.  Physical Exam:  General: alert, cooperative and no distress Chest: no respiratory distress Heart:regular rate, distal pulses intact Abdomen: soft, nontender, +BS Uterine Fundus: firm, appropriately tender Incision: pressure dressing in place- clean and dry DVT Evaluation: No calf swelling or tenderness Extremities: noe edema Skin: warm, dry;   Results for orders placed or performed during the hospital encounter of 12/27/20 (from the past 24 hour(s))  Comprehensive metabolic panel     Status: Abnormal   Collection Time: 12/30/20  4:58 AM  Result Value Ref Range   Sodium 136 135 - 145 mmol/L   Potassium 4.1 3.5 - 5.1 mmol/L   Chloride 106 98 - 111 mmol/L   CO2 19 (L) 22 - 32 mmol/L   Glucose, Bld 120 (H) 70 - 99 mg/dL   BUN 18 6 - 20 mg/dL   Creatinine, Ser 1.08 (H) 0.44 - 1.00 mg/dL   Calcium 6.9 (L) 8.9 - 10.3 mg/dL   Total Protein 5.0 (L) 6.5 - 8.1 g/dL   Albumin 2.1 (L) 3.5 - 5.0 g/dL   AST 26 15 - 41 U/L   ALT 15 0 - 44 U/L   Alkaline Phosphatase 105 38 - 126 U/L   Total Bilirubin 0.3 0.3 - 1.2 mg/dL   GFR, Estimated >60 >60 mL/min   Anion gap 11 5 - 15  CBC     Status: Abnormal   Collection Time: 12/30/20  4:58 AM  Result Value Ref Range   WBC 14.0 (H) 4.0 - 10.5 K/uL   RBC 2.80 (L) 3.87 - 5.11 MIL/uL   Hemoglobin 8.8 (L) 12.0 - 15.0  g/dL   HCT 24.6 (L) 36.0 - 46.0 %   MCV 87.9 80.0 - 100.0 fL   MCH 31.4 26.0 - 34.0 pg   MCHC 35.8 30.0 - 36.0 g/dL   RDW 13.4 11.5 - 15.5 %   Platelets 255 150 - 400 K/uL   nRBC 0.0 0.0 - 0.2 %    Assessment/Plan: ALUCENDIA LEARDis a 30y.o. [id number]/p LTCS at 371w6dor NRFHT- POD#2 -Preeclampsia with severe features  S/p Magnesium x 24hrs  BP stable, no medication  Labs as above- due to elevated Cr, plan to hold off on NSAIDs, repeat CMP in am -Pain management  Transition to oral tylenol and neurontin, oxycodone as needed -Anxiety/Depression  Pt declined restarting medication, endorsed large support network at home and therapist  Reports f/u with Dr. ArAdele Schilderper pt follow up not for several months.  Message sent to Dr. ArAdele Schildernd encouraged pt to call to reschedule for a follow up sooner -Rubella nonimmune : MMR ordered -Breastfeeding support given -Contraception: Vasectomy -Baby boy circ completed  Dispo:  Continue postpartum care as outlined above, plan for discharge home tomorrow   LOS: 2 days   JeJanyth PupaDO Faculty Attending, [facility] [employer]/11/2020, 9:41  AM
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
## 2020-12-30 NOTE — Lactation Note (Signed)
This note was copied from a baby's chart. Lactation Consultation Note  Patient Name: Casey West [id number] Date: 12/30/2020 Reason for consult: Follow-up assessment;Early term 60-38.6wks Age:32 hours    Mom states she is using her personal medela pump but is familiar with the hospital grade DEBP, (used with her first).  LC encouraged her to use the hospital while here due to stronger motor and increased stimulation to milk supply.    Mom has two containers with milk in them from previous pump session.  She plans to give the milk to the infant with next feeding.  When asked about putting infant to breast, she describes him as being sleepy and not getting much from the breast but now feels encouraged after seeing milk collected after pumping. She has no breast/nipple discomfort and feels he latches easily.    LC reviewed basics when latching and encouraged STS and hand expression before and after latch attempt or pumping.  Waking techniques discussed.  LC provided support group information to mom.    Plan: Encouraged mom to Breastfeed infant and then supplement after feeding with EBM 1st, then formula. Call out for latch assistance/observation when ready. Continue to pump 8 times in 24 hour to stimulate milk supply/collect milk for infant.   Maternal Data    Feeding Mother's Current Feeding Choice: Breast Milk and Formula  LATCH Score                    Lactation Tools Discussed/Used Tools: Pump Breast pump type: Other (comment) (mom using personal/LC encouraged hosp. grade pump) Pumping frequency: encouraged mom to pump every 3 hours or after BF Pumped volume: 15 mL (approx 15 with last pump session)  Interventions Interventions: Breast feeding basics reviewed  Discharge Pump: Personal  Consult Status Consult Status: Follow-up Date: 12/31/20 Follow-up type: In-patient    Maryruth Hancock [facility] 12/30/2020, 9:14 AM

## 2020-12-31 LAB — COMPREHENSIVE METABOLIC PANEL
ALT: 16 U/L (ref 0–44)
AST: 25 U/L (ref 15–41)
Albumin: 2 g/dL — ABNORMAL LOW (ref 3.5–5.0)
Alkaline Phosphatase: 89 U/L (ref 38–126)
Anion gap: 7 (ref 5–15)
BUN: 14 mg/dL (ref 6–20)
CO2: 25 mmol/L (ref 22–32)
Calcium: 8 mg/dL — ABNORMAL LOW (ref 8.9–10.3)
Chloride: 105 mmol/L (ref 98–111)
Creatinine, Ser: 0.8 mg/dL (ref 0.44–1.00)
GFR, Estimated: >60 mL/min (ref >60)
Glucose, Bld: 99 mg/dL (ref 70–99)
Potassium: 4.1 mmol/L (ref 3.5–5.1)
Sodium: 137 mmol/L (ref 135–145)
Total Bilirubin: 0.5 mg/dL (ref 0.3–1.2)
Total Protein: 4.7 g/dL — ABNORMAL LOW (ref 6.5–8.1)

## 2020-12-31 LAB — SURGICAL PATHOLOGY

## 2020-12-31 MED ORDER — ARIPIPRAZOLE 5 MG PO TABS: 5.0000 mg | ORAL_TABLET | Freq: Every day | ORAL | 1 refills | Status: DC

## 2020-12-31 MED ORDER — CITALOPRAM HYDROBROMIDE 20 MG PO TABS
20.0000 mg | ORAL_TABLET | Freq: Every day | ORAL | 1 refills | Status: DC
Start: 2020-12-31 — End: 2021-02-12

## 2020-12-31 MED ORDER — IBUPROFEN 600 MG PO TABS: 600.0000 mg | ORAL_TABLET | Freq: Four times a day (QID) | ORAL | 0 refills | Status: DC | PRN

## 2020-12-31 MED ORDER — OXYCODONE HCL 5 MG PO TABS: 5.0000 mg | ORAL_TABLET | Freq: Four times a day (QID) | ORAL | 0 refills | Status: AC | PRN

## 2020-12-31 NOTE — Progress Notes (Signed)
POSTPARTUM PROGRESS NOTE  PPD #3  Subjective:  Casey West is a 30 y.o. G5P2032 s/p LTCS at [redacted]w[redacted]d. Doing well and no acute complaints.  She denies any problems with ambulating, voiding or po intake. Denies nausea or vomiting. She has passed flatus, no BM- but feels like she needs to.  She reports taking Miralax and hoping that will help today. Pain well controlled.  Lochia is minimal.  No headache, no blurry vision, no RUQ pain.  Objective: Blood pressure 117/79, pulse 69, temperature 97.9 F (36.6 C), temperature source Oral, resp. rate 16, height 5' 2" (1.575 m), weight 80.8 kg, SpO2 95 %, unknown if currently breastfeeding.  Physical Exam:  General: alert, cooperative and no distress Chest: no respiratory distress Heart:regular rate Abdomen: soft, nontender, +BS Uterine Fundus: firm, appropriately tender Incision: honeycomb in place- C/D/I DVT Evaluation: No calf swelling or tenderness Extremities: no edema Skin: warm, dry;   Results for orders placed or performed during the hospital encounter of 12/27/20 (from the past 24 hour(s))  Comprehensive metabolic panel     Status: Abnormal   Collection Time: 12/31/20  5:44 AM  Result Value Ref Range   Sodium 137 135 - 145 mmol/L   Potassium 4.1 3.5 - 5.1 mmol/L   Chloride 105 98 - 111 mmol/L   CO2 25 22 - 32 mmol/L   Glucose, Bld 99 70 - 99 mg/dL   BUN 14 6 - 20 mg/dL   Creatinine, Ser 0.80 0.44 - 1.00 mg/dL   Calcium 8.0 (L) 8.9 - 10.3 mg/dL   Total Protein 4.7 (L) 6.5 - 8.1 g/dL   Albumin 2.0 (L) 3.5 - 5.0 g/dL   AST 25 15 - 41 U/L   ALT 16 0 - 44 U/L   Alkaline Phosphatase 89 38 - 126 U/L   Total Bilirubin 0.5 0.3 - 1.2 mg/dL   GFR, Estimated >60 >60 mL/min   Anion gap 7 5 - 15    Assessment/Plan: Casey West is a 30 y.o. G5P2032 s/p LTCS at [redacted]w[redacted]d for NRFHT- POD#2 -Preeclampsia with severe features  S/p Magnesium x 24hrs  BP stable, no medication  Labs as above- due to elevated Cr, plan to hold off on NSAIDs,  repeat CMP in am -Pain management  Cr improving, will add ibuprofen to pain management for home -Anxiety/Depression  Since Dr. Arfeen's office has not yet called her back, reviewed message from their office advising to restart Celexa and Abilify- pt agreed to start at home, Rx sent in -Rubella nonimmune : MMR ordered -Breastfeeding -Contraception: Vasectomy -Baby boy circ completed  Dispo:  Plan for discharge home today with one week follow up   LOS: 3 days    , DO Faculty Attending, [facility] 12/31/2020, 9:45 AM 

## 2020-12-31 NOTE — Lactation Note (Signed)
This note was copied from a baby's chart. Lactation Consultation Note  Patient Name: Casey West NOTRR'N Date: 12/31/2020 Reason for consult: Follow-up assessment;Early term 37-38.6wks (discharge) Age:32 hours   Mom and baby to d/c today. Mom and baby are breastfeeding independently this morning. Mom has +uterine cramps and denies difficult or painful latch. She continues current plan of care: bf q 3 hours followed by 20-68mLs supplementation. She continues to pump. Mom has +breast changes today. We discussed feeding expectations in the next 48 hours. Mom is aware of strategies to reduce risk of engorgement. She is aware of community resources. Patient was provided with the opportunity to ask questions. All concerns were addressed.    Feeding Mother's Current Feeding Choice: Breast Milk and Formula  Lactation Tools Discussed/Used Pumping frequency: q3 Pumped volume: 10 mL  Interventions Interventions: Education;Expressed milk;Ice;DEBP  Discharge Discharge Education: Engorgement and breast care;Warning signs for feeding baby  Consult Status Consult Status: Complete Follow-up type: In-patient  Elder Negus, MA IBCLC 12/31/2020, 8:24 AM

## 2021-01-01 ENCOUNTER — Encounter: Payer: No Typology Code available for payment source | Admitting: Women's Health

## 2021-01-07 ENCOUNTER — Encounter: Payer: No Typology Code available for payment source | Admitting: Obstetrics & Gynecology

## 2021-01-13 ENCOUNTER — Ambulatory Visit: Payer: No Typology Code available for payment source | Admitting: Obstetrics & Gynecology

## 2021-01-13 ENCOUNTER — Other Ambulatory Visit: Payer: Self-pay

## 2021-01-13 ENCOUNTER — Encounter: Payer: Self-pay | Admitting: Obstetrics & Gynecology

## 2021-01-13 VITALS — BP 115/80 | HR 87 | Wt 154.4 lb

## 2021-01-13 DIAGNOSIS — Z4889 Encounter for other specified surgical aftercare: Secondary | ICD-10-CM

## 2021-01-13 DIAGNOSIS — R3989 Other symptoms and signs involving the genitourinary system: Secondary | ICD-10-CM

## 2021-01-13 NOTE — Progress Notes (Signed)
    Post Partum Visit Note  DHANA TOTTON is a 30 y.o. [id number] female who presents for a postoperative visit. She is 2 week postop following a Primary C-section 12/28/20.  Today she notes some mild discomfort- no longer taking pain medication Denies fever or chills.  Tolerating gen diet.  +Flatus, Regular BMs.  Overall doing well and reports no acute complaints  Pregnancy complicated by: -Preeclampsia with severe features- BP within normal limits, no meds currently  -Dep/anxiety- restarted on Cymbalta and Abilify has plans to follow up with psychiatrist   Review of Systems Pertinent items are noted in HPI.    Objective:  BP 115/80 (BP Location: Right Arm, Patient Position: Sitting, Cuff Size: Normal)   Pulse 87   Wt 154 lb 6.4 oz (70 kg)   Breastfeeding No   BMI 28.24 kg/m    Physical Examination:  GENERAL ASSESSMENT: well developed and well nourished SKIN: normal color, no lesions CHEST: normal air exchange, respiratory effort normal with no retractions HEART: regular rate and rhythm ABDOMEN: soft, non-distended INCISION: clean/dry/intact- healing appropriately EXTREMITY: no edema PSYCH: mood appropriate, normal affect       Assessment:    29yo [id number] for 2wk postop incision check   Plan:   Healing appropriately and meeting milestones.  F/U in 3-4wks for final postpartum visit  Myna Hidalgo, DO Attending Obstetrician & Gynecologist, [facility], [facility]

## 2021-01-14 ENCOUNTER — Other Ambulatory Visit: Payer: Self-pay | Admitting: Advanced Practice Midwife

## 2021-01-14 ENCOUNTER — Telehealth: Payer: Self-pay

## 2021-01-14 MED ORDER — DICLOXACILLIN SODIUM 500 MG PO CAPS
500.0000 mg | ORAL_CAPSULE | Freq: Four times a day (QID) | ORAL | 0 refills | Status: DC
Start: 2021-01-14 — End: 2021-02-02

## 2021-01-14 NOTE — Telephone Encounter (Signed)
Called pt to inform her of prescription sent in for mastitis. Instructed her to let us know if there is no improvement in a couple of days. Pt confirms understanding.

## 2021-01-14 NOTE — Telephone Encounter (Signed)
Pt called with c/o fever of 101.3' and body aches. Pt was instructed to take Tylenol and wear cool clothing, instead of wrapping up. Pt then stated that she had a clogged milk duct. Instructed to use warm compresses and a hot shower. Pt stated that she had already tried those with no relief or milk expression. Will msg a provider for further advice and possible antibiotic for mastitis. Pt confirms understanding.

## 2021-02-02 ENCOUNTER — Encounter: Payer: Self-pay | Admitting: Women's Health

## 2021-02-02 ENCOUNTER — Other Ambulatory Visit: Payer: Self-pay

## 2021-02-02 ENCOUNTER — Ambulatory Visit (INDEPENDENT_AMBULATORY_CARE_PROVIDER_SITE_OTHER): Payer: No Typology Code available for payment source | Admitting: Women's Health

## 2021-02-02 ENCOUNTER — Other Ambulatory Visit (HOSPITAL_COMMUNITY)
Admission: RE | Admit: 2021-02-02 | Discharge: 2021-02-02 | Disposition: A | Discharge status: Home / Self Care | Payer: No Typology Code available for payment source | Source: Ambulatory Visit | Attending: Obstetrics & Gynecology | Admitting: Obstetrics & Gynecology

## 2021-02-02 DIAGNOSIS — O8613 Vaginitis following delivery: Secondary | ICD-10-CM | POA: Insufficient documentation

## 2021-02-02 DIAGNOSIS — B9689 Other specified bacterial agents as the cause of diseases classified elsewhere: Secondary | ICD-10-CM | POA: Diagnosis not present

## 2021-02-02 DIAGNOSIS — N898 Other specified noninflammatory disorders of vagina: Secondary | ICD-10-CM | POA: Insufficient documentation

## 2021-02-02 MED ORDER — LO LOESTRIN FE 1 MG-10 MCG / 10 MCG PO TABS: 1.0000 | ORAL_TABLET | Freq: Every day | ORAL | 3 refills | Status: DC

## 2021-02-02 NOTE — Progress Notes (Signed)
POSTPARTUM VISIT Patient name: Casey West MRN [id number]  Date of birth: 08/02/1991 Chief Complaint:   Postpartum Care (? Yeast infection)  History of Present Illness:   Casey West is a 30 y.o. [phone] Caucasian female being seen today for a postpartum visit. She is 5 weeks postpartum following a primary cesarean section, low transverse incision at 37.6 gestational weeks d/t NRFHR @ 10cm w/ failed vacuum. IOL: yes, for severe preeclampsia . Anesthesia: epidural.  Laceration: n/a.  Complications: none. Inpatient contraception: no.   Pregnancy complicated by pre-e. Tobacco use: no. Substance use disorder: no. Last pap smear: 02/04/20 and results were NILM w/ HRHPV negative. Next pap smear due: 2024 No LMP recorded.  Postpartum course has been uncomplicated. Reports vaginal d/c and itching x 4-5 days. Bleeding none. Bowel function is normal. Bladder function is normal. Urinary incontinence? no, fecal incontinence? no Patient is not sexually active. Last sexual activity: prior to birth of baby. Desired contraception: COCs until husband cleared from vasectomy (did 2wks ago). Patient does not want a pregnancy in the future.  Desired family size is 2 children.   Upstream - 02/02/21 1218      Pregnancy Intention Screening   Does the patient want to become pregnant in the next year? No    Does the patient's partner want to become pregnant in the next year? No    Would the patient like to discuss contraceptive options today? No      Contraception Wrap Up   Current Method Vasectomy    End Method Vasectomy    Contraception Counseling Provided No          The pregnancy intention screening data noted above was reviewed. Potential methods of contraception were discussed. The patient elected to proceed with Oral Contraceptive and Vasectomy.   Edinburgh Postpartum Depression Screening: negative, doing well on celexa & abilify  Edinburgh Postnatal Depression Scale - 02/02/21 1216       Edinburgh Postnatal Depression Scale:  In the Past 7 Days   I have been able to laugh and see the funny side of things. 0    I have looked forward with enjoyment to things. 0    I have blamed myself unnecessarily when things went wrong. 1    I have been anxious or worried for no good reason. 0    I have felt scared or panicky for no good reason. 0    Things have been getting on top of me. 0    I have been so unhappy that I have had difficulty sleeping. 0    I have felt sad or miserable. 0    I have been so unhappy that I have been crying. 0    The thought of harming myself has occurred to me. 0    Edinburgh Postnatal Depression Scale Total 1          Baby's course has been uncomplicated. Baby is feeding by bottle. Infant has a pediatrician/family doctor? Yes.  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  Pt has material needs met for her and baby: Yes.   Review of Systems:   Pertinent items are noted in HPI Denies Abnormal vaginal discharge w/ itching/odor/irritation, headaches, visual changes, shortness of breath, chest pain, abdominal pain, severe nausea/vomiting, or problems with urination or bowel movements. Pertinent History Reviewed:  Reviewed past medical,surgical, obstetrical and family history.  Reviewed problem list, medications and allergies. OB History  Gravida Para Term Preterm AB Living  5 2 2  0 3 2  SAB IAB Ectopic Multiple Live Births  0 3 0 0 2    # Outcome Date GA Lbr Len/2nd Weight Sex Delivery Anes PTL Lv  5 Term 12/28/20 [redacted]w[redacted]d/ 00:22 5 lb 11.7 oz (2.6 kg) M CS-LTranv EPI  LIV  4 Term 05/02/19 [id number] / 00:18 5 lb 5.5 oz (2.425 kg) F Vag-Spont EPI N LIV     Complications: Preeclampsia  3 IAB           2 IAB           1 IAB            Physical Assessment:   Vitals:   02/02/21 1205  BP: 101/72  Pulse: 77  Weight: 158 lb (71.7 kg)  Height: 5' 1"  (1.549 m)  Body mass index is 29.85 kg/m.       Physical Examination:    General appearance: alert, well appearing, and in no distress  Mental status: alert, oriented to person, place, and time  Skin: warm & dry   Cardiovascular: normal heart rate noted   Respiratory: normal respiratory effort, no distress   Breasts: deferred, no complaints   Abdomen: soft, non-tender   Pelvic: VULVA: normal appearing vulva with no masses, tenderness or lesions, VAGINA: normal appearing vagina with normal color and discharge, no lesions, CERVIX: normal appearing cervix without discharge or lesions. Thin prep pap obtained: No  Rectal: not examined  Extremities: Edema: none   Chaperone: JaLevy Pupa       No results found for this or any previous visit (from the past 24 hour(s)).  Assessment & Plan:  1) Postpartum exam 2) 5 wks s/p primary cesarean section, low transverse incision d/t failed vacuum, NRFHR, IOL for severe pre-e 3) bottle feeding 4) Depression screening 5) Contraception management: rx LoLoestrin x 50m86m until husband cleared from vasectomy  6) Vaginal d/c & itching> CV swab sent 7) Dep/anx/PTSD> doing well on celexa & abilify  Essential components of care per ACOG recommendations:  1.  Mood and well being:  . If positive depression screen, discussed and plan developed.  . If using tobacco we discussed reduction/cessation and risk of relapse . If current substance abuse, we discussed and referral to local resources was offered.   2. Infant care and feeding:  . If breastfeeding, discussed returning to work, pumping, breastfeeding-associated pain, guidance regarding return to fertility while lactating if not using another method. If needed, patient was provided with a letter to be allowed to pump q 2-3hrs to support lactation in a private location with access to a refrigerator to store breastmilk.   . Recommended that all caregivers be immunized for flu, pertussis and other preventable communicable diseases . If pt does not have material needs met for  her/baby, referred to local resources for help obtaining these.  3. Sexuality, contraception and birth spacing . Provided guidance regarding sexuality, management of dyspareunia, and resumption of intercourse . Discussed avoiding interpregnancy interval <6mt31mand recommended birth spacing of 18 months  4. Sleep and fatigue . Discussed coping options for fatigue and sleep disruption . Encouraged family/partner/community support of 4 hrs of uninterrupted sleep to help with mood and fatigue  5. Physical recovery  . If pt had a C/S, assessed incisional pain and providing guidance on normal vs prolonged recovery . If pt had a laceration, perineal healing and pain reviewed.  . If urinary or fecal incontinence, discussed management and referred to PT or  uro/gyn if indicated  . Patient is safe to resume physical activity. Discussed attainment of healthy weight.  6.  Chronic disease management . Discussed pregnancy complications if any, and their implications for future childbearing and long-term maternal health. . Review recommendations for prevention of recurrent pregnancy complications, such as 17 hydroxyprogesterone caproate to reduce risk for recurrent PTB not applicable, or aspirin to reduce risk of preeclampsia yes. . Pt had GDM: No. If yes, 2hr GTT scheduled: not applicable. . Reviewed medications and non-pregnant dosing including consideration of whether pt is breastfeeding using a reliable resource such as LactMed: not applicable . Referred for f/u w/ PCP or subspecialist providers as indicated: not applicable  7. Health maintenance . Mammogram at 32yo or earlier if indicated . Pap smears as indicated  Meds:  Meds ordered this encounter  Medications  . LO LOESTRIN FE 1 MG-10 MCG / 10 MCG tablet    Sig: Take 1 tablet by mouth daily.    Dispense:  90 tablet    Refill:  3    For co-pay card, pt to text "Lo Loestrin Fe " to [phone]              Co-pay card must be run in second position   "other coverage code 3"  if denied d/t PA, step edit, or insurance denial    Order Specific Question:   Supervising Provider    Answer:   Florian Buff [[id number]]    Follow-up: Return in about 3 months (around 05/04/2021) for med f/u, in person, CNM.   No orders of the defined types were placed in this encounter.   [place], [facility] 02/02/2021 1:02 PM

## 2021-02-03 ENCOUNTER — Telehealth: Payer: Self-pay | Admitting: *Deleted

## 2021-02-03 LAB — CERVICOVAGINAL ANCILLARY ONLY
Bacterial Vaginitis (gardnerella): POSITIVE — AB
Chlamydia: NEGATIVE
Comment: NEGATIVE
Comment: NEGATIVE
Comment: NEGATIVE
Comment: NORMAL
Neisseria Gonorrhea: NEGATIVE
Trichomonas: NEGATIVE

## 2021-02-03 MED ORDER — METRONIDAZOLE 500 MG PO TABS: 500.0000 mg | ORAL_TABLET | Freq: Two times a day (BID) | ORAL | 0 refills | Status: DC

## 2021-02-03 NOTE — Telephone Encounter (Signed)
Left message @ 1:52 pm. [name] ?

## 2021-02-03 NOTE — Addendum Note (Signed)
Addended by: Cheral Marker on: 02/03/2021 01:17 PM   Modules accepted: Orders

## 2021-02-03 NOTE — Telephone Encounter (Signed)
-----   Message from Kimberly R Booker, CNM sent at 02/03/2021  1:18 PM EDT ----- Doesn't have mychart, please let her know swab+BV, rx sent. Thanks 

## 2021-02-04 NOTE — Telephone Encounter (Signed)
Pt aware CV swab was + for BV and med was sent to pharmacy. Advised no sex or alcohol while on med. Pt voiced understanding. [name]

## 2021-02-04 NOTE — Telephone Encounter (Signed)
Left message @ 11:48 am. [name] 

## 2021-02-04 NOTE — Telephone Encounter (Signed)
-----   Message from Cheral Marker, [place] sent at 02/03/2021  1:18 PM EDT ----- Doesn't have mychart, please let her know swab+BV, rx sent. Thanks

## 2021-02-04 NOTE — Telephone Encounter (Signed)
-----   Message from Kimberly R Booker, CNM sent at 02/03/2021  1:18 PM EDT ----- Doesn't have mychart, please let her know swab+BV, rx sent. Thanks 

## 2021-02-12 ENCOUNTER — Other Ambulatory Visit: Payer: Self-pay

## 2021-02-12 ENCOUNTER — Encounter (HOSPITAL_COMMUNITY): Payer: Self-pay | Admitting: Psychiatry

## 2021-02-12 ENCOUNTER — Telehealth (INDEPENDENT_AMBULATORY_CARE_PROVIDER_SITE_OTHER): Payer: No Typology Code available for payment source | Admitting: Psychiatry

## 2021-02-12 VITALS — Wt 157.0 lb

## 2021-02-12 DIAGNOSIS — F319 Bipolar disorder, unspecified: Secondary | ICD-10-CM

## 2021-02-12 DIAGNOSIS — F4310 Post-traumatic stress disorder, unspecified: Secondary | ICD-10-CM

## 2021-02-12 MED ORDER — ARIPIPRAZOLE 5 MG PO TABS: 5.0000 mg | ORAL_TABLET | Freq: Every day | ORAL | 1 refills | Status: DC

## 2021-02-12 MED ORDER — CITALOPRAM HYDROBROMIDE 20 MG PO TABS: 20.0000 mg | ORAL_TABLET | Freq: Every day | ORAL | 1 refills | Status: DC
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
## 2021-04-14 ENCOUNTER — Other Ambulatory Visit: Payer: Self-pay

## 2021-04-14 ENCOUNTER — Telehealth (HOSPITAL_COMMUNITY): Payer: No Typology Code available for payment source | Admitting: Psychiatry

## 2021-05-05 ENCOUNTER — Ambulatory Visit: Payer: No Typology Code available for payment source | Admitting: Women's Health

## 2021-05-08 IMAGING — DX DG CHEST 1V PORT
1 series · 1 of 1 positions shown · non-contrast
Comparison: None.

CLINICAL DATA: Vaginal bleeding

EXAM:
PORTABLE CHEST 1 VIEW

[chest ap]
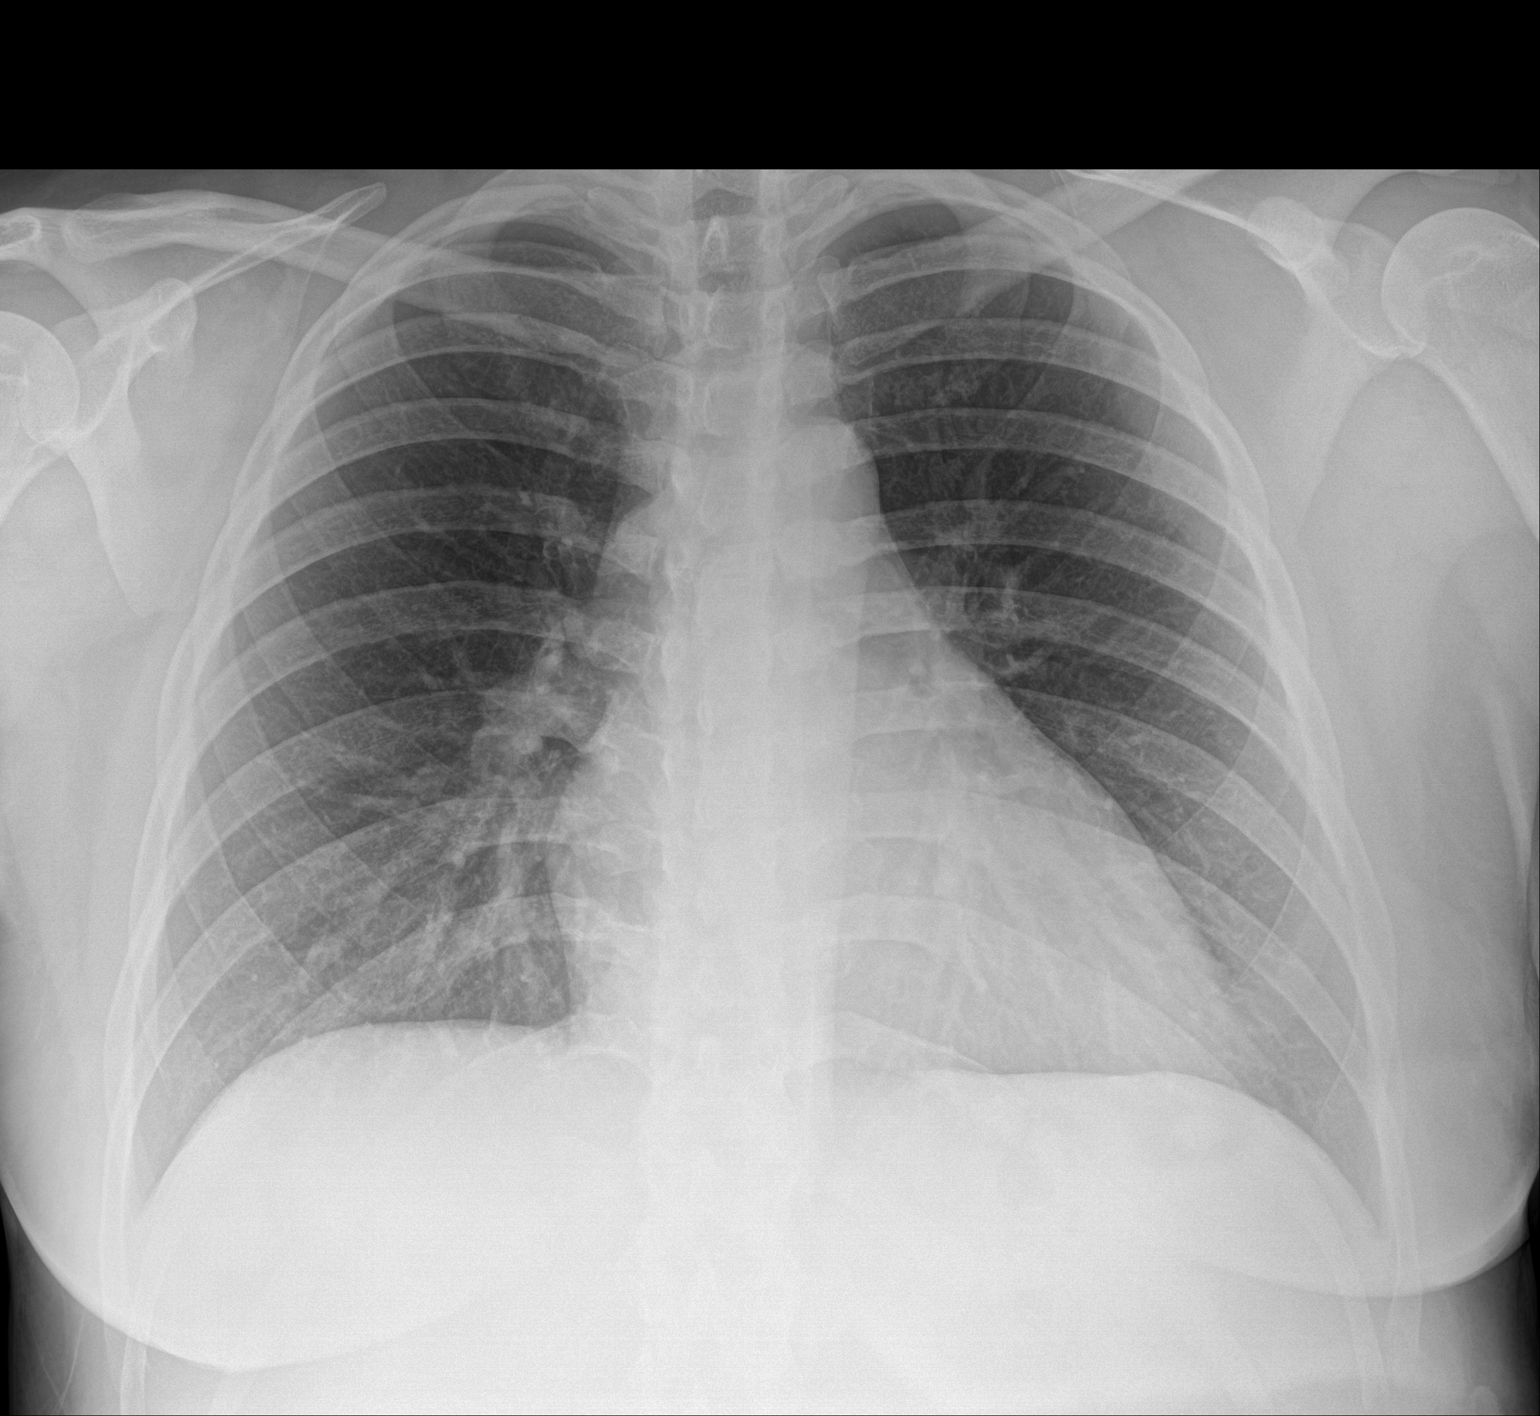

[1 of 1 positions shown; findings below may reference images not displayed]

FINDINGS: The heart size and mediastinal contours are within normal limits.
Both lungs are clear. The visualized skeletal structures are
unremarkable.
IMPRESSION: No active disease.

## 2021-08-06 ENCOUNTER — Telehealth (HOSPITAL_COMMUNITY): Payer: Self-pay | Admitting: *Deleted

## 2021-08-06 NOTE — Telephone Encounter (Signed)
Patient called [facility] in [place] stating she is looking for a new psychiatrist. Per pt she try getting in contact with her's in [place] and can never get a hold of anyone. Per pt she don't want to keep seeing a psychiatrist and can't get a hold of them. Verified with patient to make sure she is calling the correct number and patient verified. Staff asked patient if she is leaving a message and which ext is she selecting. Per pt she is leaving a message and she don't remember which ext she's selecting. Staff offered to transfer patient to the [facility] location. Patient stated can't staff just change her to another provider. Staff informed patient that due to staff not being in that office, patient will need to reach out to [facility] [place] [facility] to see if they can. Staff then offer patient if patient wanted to be transferred? Patient stated she will just call office.   Staff called Provider office [facility] and spoke with [place] and informed her about patient and asked her to please call patient just to make sure. Staff informed Marcelino Duster that patient stated she had concerns about her medication. Marcelino Duster agreed to call patient.

## 2021-08-07 ENCOUNTER — Other Ambulatory Visit: Payer: Self-pay

## 2021-08-07 ENCOUNTER — Telehealth (HOSPITAL_BASED_OUTPATIENT_CLINIC_OR_DEPARTMENT_OTHER): Payer: No Typology Code available for payment source | Admitting: Psychiatry

## 2021-08-07 ENCOUNTER — Encounter (HOSPITAL_COMMUNITY): Payer: Self-pay | Admitting: Psychiatry

## 2021-08-07 DIAGNOSIS — F319 Bipolar disorder, unspecified: Secondary | ICD-10-CM

## 2021-08-07 DIAGNOSIS — F4310 Post-traumatic stress disorder, unspecified: Secondary | ICD-10-CM | POA: Diagnosis not present

## 2021-08-07 MED ORDER — ARIPIPRAZOLE 5 MG PO TABS: 5.0000 mg | ORAL_TABLET | Freq: Every day | ORAL | 1 refills | Status: DC

## 2021-08-07 MED ORDER — CITALOPRAM HYDROBROMIDE 20 MG PO TABS: 20.0000 mg | ORAL_TABLET | Freq: Every day | ORAL | 1 refills | Status: DC
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
## 2021-09-07 ENCOUNTER — Other Ambulatory Visit: Payer: Self-pay

## 2021-09-07 ENCOUNTER — Telehealth (HOSPITAL_COMMUNITY): Payer: No Typology Code available for payment source | Admitting: Psychiatry

## 2021-09-07 ENCOUNTER — Encounter (HOSPITAL_COMMUNITY): Payer: Self-pay | Admitting: Psychiatry

## 2023-03-09 ENCOUNTER — Other Ambulatory Visit (HOSPITAL_COMMUNITY)
Admission: RE | Admit: 2023-03-09 | Discharge: 2023-03-09 | Disposition: A | Discharge status: Home / Self Care | Payer: 59 | Source: Ambulatory Visit | Attending: Adult Health | Admitting: Adult Health

## 2023-03-09 ENCOUNTER — Encounter: Payer: Self-pay | Admitting: Adult Health

## 2023-03-09 ENCOUNTER — Ambulatory Visit: Payer: 59 | Admitting: Adult Health

## 2023-03-09 VITALS — BP 104/73 | HR 72 | Ht 61.0 in | Wt 155.5 lb

## 2023-03-09 DIAGNOSIS — N760 Acute vaginitis: Secondary | ICD-10-CM | POA: Diagnosis not present

## 2023-03-09 DIAGNOSIS — N898 Other specified noninflammatory disorders of vagina: Secondary | ICD-10-CM

## 2023-03-09 DIAGNOSIS — R8290 Unspecified abnormal findings in urine: Secondary | ICD-10-CM | POA: Diagnosis not present

## 2023-03-09 DIAGNOSIS — B9689 Other specified bacterial agents as the cause of diseases classified elsewhere: Secondary | ICD-10-CM | POA: Diagnosis not present

## 2023-03-09 DIAGNOSIS — Z124 Encounter for screening for malignant neoplasm of cervix: Secondary | ICD-10-CM | POA: Insufficient documentation

## 2023-03-09 LAB — POCT URINALYSIS DIPSTICK
Blood, UA: NEGATIVE
Glucose, UA: NEGATIVE
Ketones, UA: NEGATIVE
Leukocytes, UA: NEGATIVE
Nitrite, UA: NEGATIVE
Protein, UA: NEGATIVE

## 2023-03-09 LAB — POCT WET PREP (WET MOUNT)
Clue Cells Wet Prep Whiff POC: NEGATIVE
WBC, Wet Prep HPF POC: POSITIVE

## 2023-03-09 MED ORDER — METRONIDAZOLE 500 MG PO TABS: 500.0000 mg | ORAL_TABLET | Freq: Two times a day (BID) | ORAL | 0 refills | Status: AC

## 2023-03-09 NOTE — Progress Notes (Signed)
  Subjective:     Patient ID: Casey West, female   DOB: Jul 25, 1991, 32 y.o.   MRN: [id number]  HPI Casey West is a 32 year old white female,married, [id number] in complaining of vaginal irritation and itching up in side, and has vaginal discharge and urine smells sweet. And she needs a pap.  PCP is Roma Kayser PA   Review of Systems +vaginal irritation +vaginal itching up in side  +vaginal discharge +sweet smell to urine  Reviewed past medical,surgical, social and family history. Reviewed medications and allergies.     Objective:   Physical Exam BP 104/73 (BP Location: Left Arm, Patient Position: Sitting, Cuff Size: Normal)   Pulse 72   Ht 5\' 1"  (1.549 m)   Wt 155 lb 8 oz (70.5 kg)   LMP 02/27/2023 (Approximate)   BMI 29.38 kg/m  urine dipstick negative. Skin warm and dry.Pelvic: external genitalia is normal in appearance no lesions, vagina: white discharge without odor,urethra has no lesions or masses noted, cervix: bulbous, friable with EC brush, pap with GC/CHL and HR HPV genotyping performed, uterus: normal size, shape and contour, non tender, no masses felt, adnexa: no masses or tenderness noted. Bladder is non tender and no masses felt. Wet prep: + for clue cells and +WBCs, no yeast seen.  Fall risk is low  [facility] - 03/09/23 1124       Pregnancy Intention Screening   Does the patient want to become pregnant in the next year? No    Does the patient's partner want to become pregnant in the next year? No    Would the patient like to discuss contraceptive options today? No      Contraception Wrap Up   Current Method Vasectomy    End Method Vasectomy            Examination chaperoned by Malachy Mood LPN    Assessment:     1. Routine Papanicolaou smear Pap sent Pap in 3 years if normal - Cytology - PAP( [facility])  2. Abnormal urine odor Urine dipstick was negative  - POCT Urinalysis Dipstick  3. Vaginal itching +itching up in vagina - POCT Wet Prep [facility])  4. Vaginal irritation - POCT Wet Prep [facility])  5. Vaginal discharge  - POCT Wet Prep [facility])  6. BV (bacterial vaginosis) +clue cells on wet prep will rx flagyl, no sex or alcohol while taking Meds ordered this encounter  Medications   metroNIDAZOLE (FLAGYL) 500 MG tablet    Sig: Take 1 tablet (500 mg total) by mouth 2 (two) times daily.    Dispense:  14 tablet    Refill:  0    Order Specific Question:   Supervising Provider    Answer:   Lazaro Arms [[id number]]    - POCT Wet Prep [facility])     Plan:     Follow up prn

## 2023-03-11 LAB — CYTOLOGY - PAP
Comment: NEGATIVE
Diagnosis: NEGATIVE
High risk HPV: NEGATIVE

## 2024-08-17 ENCOUNTER — Other Ambulatory Visit: Payer: Self-pay

## 2024-08-17 ENCOUNTER — Emergency Department (HOSPITAL_COMMUNITY)
Admission: EM | Admit: 2024-08-17 | Discharge: 2024-08-17 | Disposition: A | Discharge status: Home / Self Care | Attending: Emergency Medicine | Admitting: Emergency Medicine

## 2024-08-17 ENCOUNTER — Encounter (HOSPITAL_COMMUNITY): Payer: Self-pay

## 2024-08-17 DIAGNOSIS — R21 Rash and other nonspecific skin eruption: Secondary | ICD-10-CM

## 2024-08-17 DIAGNOSIS — T7840XA Allergy, unspecified, initial encounter: Secondary | ICD-10-CM | POA: Diagnosis not present

## 2024-08-17 LAB — COMPREHENSIVE METABOLIC PANEL WITH GFR
ALT: 16 U/L (ref 0–44)
AST: 19 U/L (ref 15–41)
Albumin: 4.6 g/dL (ref 3.5–5.0)
Alkaline Phosphatase: 65 U/L (ref 38–126)
Anion gap: 12 (ref 5–15)
BUN: 18 mg/dL (ref 6–20)
CO2: 24 mmol/L (ref 22–32)
Calcium: 9.3 mg/dL (ref 8.9–10.3)
Chloride: 103 mmol/L (ref 98–111)
Creatinine, Ser: 0.98 mg/dL (ref 0.44–1.00)
GFR, Estimated: >60 mL/min (ref >60)
Glucose, Bld: 79 mg/dL (ref 70–99)
Potassium: 3.5 mmol/L (ref 3.5–5.1)
Sodium: 139 mmol/L (ref 135–145)
Total Bilirubin: 0.5 mg/dL (ref 0.0–1.2)
Total Protein: 7.5 g/dL (ref 6.5–8.1)

## 2024-08-17 LAB — CBC WITH DIFFERENTIAL/PLATELET
Abs Immature Granulocytes: 0.03 K/uL (ref 0.00–0.07)
Basophils Absolute: 0 K/uL (ref 0.0–0.1)
Basophils Relative: 0 %
Eosinophils Absolute: 0.1 K/uL (ref 0.0–0.5)
Eosinophils Relative: 2 %
HCT: 38.5 % (ref 36.0–46.0)
Hemoglobin: 13.7 g/dL (ref 12.0–15.0)
Immature Granulocytes: 0 %
Lymphocytes Relative: 23 %
Lymphs Abs: 2.1 K/uL (ref 0.7–4.0)
MCH: 31.1 pg (ref 26.0–34.0)
MCHC: 35.6 g/dL (ref 30.0–36.0)
MCV: 87.5 fL (ref 80.0–100.0)
Monocytes Absolute: 0.6 K/uL (ref 0.1–1.0)
Monocytes Relative: 6 %
Neutro Abs: 6.3 K/uL (ref 1.7–7.7)
Neutrophils Relative %: 69 %
Platelets: 269 K/uL (ref 150–400)
RBC: 4.4 MIL/uL (ref 3.87–5.11)
RDW: 11.9 % (ref 11.5–15.5)
WBC: 9.2 K/uL (ref 4.0–10.5)
nRBC: 0 % (ref 0.0–0.2)

## 2024-08-17 LAB — HCG, SERUM, QUALITATIVE: Preg, Serum: NEGATIVE

## 2024-08-17 MED ORDER — PREDNISONE 10 MG PO TABS: 40.0000 mg | ORAL_TABLET | Freq: Every day | ORAL | 0 refills | Status: AC

## 2024-08-17 MED ORDER — DEXAMETHASONE SOD PHOSPHATE PF 10 MG/ML IJ SOLN
10.0000 mg | Freq: Once | INTRAMUSCULAR | Status: AC
  Administered 2024-08-17: 10 mg via INTRAVENOUS

## 2024-08-17 MED ORDER — FAMOTIDINE 20 MG PO TABS: 20.0000 mg | ORAL_TABLET | Freq: Two times a day (BID) | ORAL | 0 refills | Status: AC

## 2024-08-17 MED ORDER — FAMOTIDINE IN NACL 20-0.9 MG/50ML-% IV SOLN
20.0000 mg | Freq: Once | INTRAVENOUS | Status: AC
  Administered 2024-08-17: 20 mg via INTRAVENOUS
  Filled 2024-08-17: qty 50

## 2024-08-17 MED ORDER — SODIUM CHLORIDE 0.9 % IV BOLUS
1000.0000 mL | Freq: Once | INTRAVENOUS | Status: AC
  Administered 2024-08-17: 1000 mL via INTRAVENOUS

## 2024-08-17 MED ORDER — DIPHENHYDRAMINE HCL 50 MG/ML IJ SOLN
25.0000 mg | Freq: Once | INTRAMUSCULAR | Status: AC
  Administered 2024-08-17: 25 mg via INTRAVENOUS
  Filled 2024-08-17: qty 1

## 2024-08-17 NOTE — Discharge Instructions (Addendum)
 Please continue to take 1 tablet of Benadryl  every 6 hours for the next few days.  Start taking the prednisone and Pepcid tomorrow as directed.  Return to the emergency department immediately for any new or worsening symptoms.

## 2024-08-17 NOTE — ED Provider Notes (Signed)
 [place] EMERGENCY DEPARTMENT AT [facility] Provider Note   CSN: [id number] Arrival date & time: 08/17/24  8090     Patient presents with: Rash   Casey West is a 33 y.o. female.   Patient is a 33 year old female who presents emergency department the chief complaint of area of redness and swelling to the posterior aspect of her left leg as well as some redness and swelling to the left side of her face.  She notes that symptoms have been worse today.  She notes that she felt as if something bit her leg last night while she was sleeping.  She also notes that her throat is feeling more tight.  She denies any associated chest pain, abdominal pain, nausea, vomiting, diarrhea.  Patient notes that she is unsure what may have bit her.  She did take Benadryl  just prior to arrival.   Rash      Prior to Admission medications   Medication Sig Start Date End Date Taking? Authorizing Provider  famotidine (PEPCID) 20 MG tablet Take 1 tablet (20 mg total) by mouth 2 (two) times daily for 5 days. 08/17/24 08/22/24 Yes Yolani Vo, Lonni D, PA-C  predniSONE (DELTASONE) 10 MG tablet Take 4 tablets (40 mg total) by mouth daily for 5 days. 08/17/24 08/22/24 Yes Loda Bialas, Lonni D, PA-C  metroNIDAZOLE  (FLAGYL ) 500 MG tablet Take 1 tablet (500 mg total) by mouth 2 (two) times daily. 03/09/23   Signa Delon LABOR, NP  Multiple Vitamins-Minerals (ONE-A-DAY WOMENS PO) Take by mouth.    [provider]    Allergies: Neomycin and Red dye #40 (allura red)    Review of Systems  Skin:  Positive for rash.  All other systems reviewed and are negative.   Updated Vital Signs BP 112/86   Pulse 70   Temp 98.7 F (37.1 C) (Oral)   Resp 18   Ht 5' 2 (1.575 m)   Wt 70.3 kg   SpO2 100%   BMI 28.35 kg/m   Physical Exam Vitals and nursing note reviewed.  Constitutional:      Appearance: Normal appearance.  HENT:     Head: Normocephalic and atraumatic.     Nose: Nose normal.      Mouth/Throat:     Mouth: Mucous membranes are moist.     Pharynx: No oropharyngeal exudate or posterior oropharyngeal erythema.  Eyes:     Extraocular Movements: Extraocular movements intact.     Conjunctiva/sclera: Conjunctivae normal.     Pupils: Pupils are equal, round, and reactive to light.  Cardiovascular:     Rate and Rhythm: Normal rate and regular rhythm.     Pulses: Normal pulses.     Heart sounds: Normal heart sounds. No murmur heard.    No gallop.  Pulmonary:     Effort: Pulmonary effort is normal. No respiratory distress.     Breath sounds: Normal breath sounds. No stridor. No wheezing, rhonchi or rales.  Abdominal:     General: Abdomen is flat. Bowel sounds are normal. There is no distension.     Palpations: Abdomen is soft.     Tenderness: There is no abdominal tenderness. There is no guarding.  Musculoskeletal:        General: Normal range of motion.     Cervical back: Normal range of motion. No rigidity or tenderness.  Lymphadenopathy:     Cervical: No cervical adenopathy.  Skin:    General: Skin is warm and dry.     Comments: Localized  area of erythema noted to the posterior aspect of the left calf and posterior knee, area of redness and swelling noted to the left side of the face, no other areas of rash, petechiae, purpura, bullae, no lesions to palms, soles, oral mucosa, no areas of fluctuance or induration  Neurological:     General: No focal deficit present.     Mental Status: She is alert and oriented to person, place, and time. Mental status is at baseline.  Psychiatric:        Mood and Affect: Mood normal.        Behavior: Behavior normal.        Thought Content: Thought content normal.        Judgment: Judgment normal.     (all labs ordered are listed, but only abnormal results are displayed) Labs Reviewed  COMPREHENSIVE METABOLIC PANEL WITH GFR  CBC WITH DIFFERENTIAL/PLATELET  HCG, SERUM, QUALITATIVE    EKG: None  Radiology: No results  found.   Procedures   Medications Ordered in the ED  diphenhydrAMINE  (BENADRYL ) injection 25 mg (25 mg Intravenous Given 08/17/24 2020)  famotidine (PEPCID) IVPB 20 mg premix (0 mg Intravenous Stopped 08/17/24 2202)  dexamethasone  (DECADRON ) injection 10 mg (10 mg Intravenous Given 08/17/24 2021)  sodium chloride  0.9 % bolus 1,000 mL (0 mLs Intravenous Stopped 08/17/24 2203)                                    Medical Decision Making Patient is doing very well at this time and is stable for discharge home.  Area of redness and swelling to the face has completely resolved with treatment in the emergency department and area redness to the left lower extremity has greatly improved.  Do not suspect underlying etiology such as cellulitis or abscess at this point given the improvement in her symptoms with steroids, Benadryl  and Pepcid.  Also do not suspect underlying etiology such as DVT at this time.  Patient had no acute findings on evaluation of the throat with no indication for acute peritonsillar abscess, Ludwig's angina, retropharyngeal abscess, epiglottitis.  She had no indication for periorbital or orbital cellulitis.  Will continue symptomatic treatment on outpatient basis and treatment for her allergic reaction.  Strict precautions were provided for any new or worsening symptoms.  Patient voiced understanding and had no additional questions.  Blood work has been unremarkable.  Amount and/or Complexity of Data Reviewed Labs: ordered.  Risk Prescription drug management.        Final diagnoses:  Allergic reaction, initial encounter  Rash and nonspecific skin eruption    ED Discharge Orders          Ordered    famotidine (PEPCID) 20 MG tablet  2 times daily        08/17/24 2218    predniSONE (DELTASONE) 10 MG tablet  Daily        08/17/24 2218               Esten Dollar D, PA-C 08/17/24 2224    Towana Ozell BROCKS, MD 08/18/24 1019

## 2024-08-17 NOTE — ED Notes (Signed)
 When placing patient in 21 from VT2- patient reports that her left side of her neck feels swollen.  Left side feels larger than right upon palpation Denies SOB RN and PA notified to eval patient.

## 2024-08-17 NOTE — ED Triage Notes (Signed)
 Pov from home cc of rash ro the back of her left lower leg. Thinks something bit her yesterday in her sleep. Says it is itchy and is hot to the touch.  Boarder drawn around rash  Took 2 benadryl  at 4pm Also thinks that her left cheek is red too

## 2024-08-17 NOTE — ED Notes (Signed)
 Pt/family received d/c paperwork at this time. After going over the paperwork any questions, comments, or concerns were answered to the best of this nurse's knowledge. The pt/family verbally acknowledged the teachings/instructions.
